# Patient Record
Sex: Female | Born: 1965 | Race: White | Hispanic: No | Marital: Married | Smoking: Current every day smoker
Health system: Southern US, Community
[De-identification: ages and names within clinical notes are randomized; demographics above are authoritative.]

## PROBLEM LIST (undated history)

## (undated) DIAGNOSIS — R569 Unspecified convulsions: Secondary | ICD-10-CM

## (undated) DIAGNOSIS — R351 Nocturia: Secondary | ICD-10-CM

## (undated) DIAGNOSIS — G629 Polyneuropathy, unspecified: Secondary | ICD-10-CM

## (undated) DIAGNOSIS — F329 Major depressive disorder, single episode, unspecified: Secondary | ICD-10-CM

## (undated) DIAGNOSIS — B399 Histoplasmosis, unspecified: Secondary | ICD-10-CM

## (undated) DIAGNOSIS — F419 Anxiety disorder, unspecified: Secondary | ICD-10-CM

## (undated) DIAGNOSIS — R718 Other abnormality of red blood cells: Secondary | ICD-10-CM

## (undated) DIAGNOSIS — M2550 Pain in unspecified joint: Secondary | ICD-10-CM

## (undated) DIAGNOSIS — R51 Headache: Secondary | ICD-10-CM

## (undated) DIAGNOSIS — H32 Chorioretinal disorders in diseases classified elsewhere: Secondary | ICD-10-CM

## (undated) DIAGNOSIS — Z9889 Other specified postprocedural states: Secondary | ICD-10-CM

## (undated) DIAGNOSIS — M2540 Effusion, unspecified joint: Secondary | ICD-10-CM

## (undated) DIAGNOSIS — R519 Headache, unspecified: Secondary | ICD-10-CM

## (undated) DIAGNOSIS — R531 Weakness: Secondary | ICD-10-CM

## (undated) DIAGNOSIS — F32A Depression, unspecified: Secondary | ICD-10-CM

## (undated) DIAGNOSIS — M1990 Unspecified osteoarthritis, unspecified site: Secondary | ICD-10-CM

## (undated) DIAGNOSIS — H548 Legal blindness, as defined in USA: Secondary | ICD-10-CM

## (undated) DIAGNOSIS — R112 Nausea with vomiting, unspecified: Secondary | ICD-10-CM

## (undated) HISTORY — PX: EYE SURGERY: SHX253

## (undated) HISTORY — PX: TUBAL LIGATION: SHX77

## (undated) HISTORY — PX: BACK SURGERY: SHX140

## (undated) HISTORY — PX: MYRINGOTOMY WITH TUBE PLACEMENT: SHX5663

---

## 2003-08-11 ENCOUNTER — Emergency Department (HOSPITAL_COMMUNITY): Admission: EM | Admit: 2003-08-11 | Discharge: 2003-08-12 | Payer: Self-pay | Admitting: *Deleted

## 2003-08-25 ENCOUNTER — Emergency Department (HOSPITAL_COMMUNITY): Admission: EM | Admit: 2003-08-25 | Discharge: 2003-08-25 | Payer: Self-pay | Admitting: Emergency Medicine

## 2003-11-10 ENCOUNTER — Emergency Department (HOSPITAL_COMMUNITY): Admission: EM | Admit: 2003-11-10 | Discharge: 2003-11-10 | Payer: Self-pay | Admitting: Emergency Medicine

## 2005-03-06 ENCOUNTER — Other Ambulatory Visit: Admission: RE | Admit: 2005-03-06 | Discharge: 2005-03-06 | Payer: Self-pay | Admitting: Family Medicine

## 2014-06-20 ENCOUNTER — Telehealth: Payer: Self-pay | Admitting: Family Medicine

## 2014-06-20 NOTE — Telephone Encounter (Signed)
Patient advised she will have to go through out billing department since she is on hold/dismissed on the [employer] side for a bad debt.

## 2014-08-17 NOTE — Patient Instructions (Signed)
Your procedure is scheduled on: 08/22/2014  Report to [facility] at  1130   AM.  Call this number if you have problems the morning of surgery: [phone]   Do not eat food or drink liquids :After Midnight.      Take these medicines the morning of surgery with A SIP OF WATER: none   Do not wear jewelry, make-up or nail polish.  Do not wear lotions, powders, or perfumes.   Do not shave 48 hours prior to surgery.  Do not bring valuables to the hospital.  Contacts, dentures or bridgework may not be worn into surgery.  Leave suitcase in the car. After surgery it may be brought to your room.  For patients admitted to the hospital, checkout time is 11:00 AM the day of discharge.   Patients discharged the day of surgery will not be allowed to drive home.  :     Please read over the following fact sheets that you were given: Coughing and Deep Breathing, Surgical Site Infection Prevention, Anesthesia Post-op Instructions and Care and Recovery After Surgery    Cataract A cataract is a clouding of the lens of the eye. When a lens becomes cloudy, vision is reduced based on the degree and nature of the clouding. Many cataracts reduce vision to some degree. Some cataracts make people more near-sighted as they develop. Other cataracts increase glare. Cataracts that are ignored and become worse can sometimes look white. The white color can be seen through the pupil. CAUSES   Aging. However, cataracts may occur at any age, even in newborns.   Certain drugs.   Trauma to the eye.   Certain diseases such as diabetes.   Specific eye diseases such as chronic inflammation inside the eye or a sudden attack of a rare form of glaucoma.   Inherited or acquired medical problems.  SYMPTOMS   Gradual, progressive drop in vision in the affected eye.   Severe, rapid visual loss. This most often happens when trauma is the cause.  DIAGNOSIS  To detect a cataract, an eye doctor examines the lens. Cataracts are  best diagnosed with an exam of the eyes with the pupils enlarged (dilated) by drops.  TREATMENT  For an early cataract, vision may improve by using different eyeglasses or stronger lighting. If that does not help your vision, surgery is the only effective treatment. A cataract needs to be surgically removed when vision loss interferes with your everyday activities, such as driving, reading, or watching TV. A cataract may also have to be removed if it prevents examination or treatment of another eye problem. Surgery removes the cloudy lens and usually replaces it with a substitute lens (intraocular lens, IOL).  At a time when both you and your doctor agree, the cataract will be surgically removed. If you have cataracts in both eyes, only one is usually removed at a time. This allows the operated eye to heal and be out of danger from any possible problems after surgery (such as infection or poor wound healing). In rare cases, a cataract may be doing damage to your eye. In these cases, your caregiver may advise surgical removal right away. The vast majority of people who have cataract surgery have better vision afterward. HOME CARE INSTRUCTIONS  If you are not planning surgery, you may be asked to do the following:  Use different eyeglasses.   Use stronger or brighter lighting.   Ask your eye doctor about reducing your medicine dose or changing medicines  if it is thought that a medicine caused your cataract. Changing medicines does not make the cataract go away on its own.   Become familiar with your surroundings. Poor vision can lead to injury. Avoid bumping into things on the affected side. You are at a higher risk for tripping or falling.   Exercise extreme care when driving or operating machinery.   Wear sunglasses if you are sensitive to bright light or experiencing problems with glare.  SEEK IMMEDIATE MEDICAL CARE IF:   You have a worsening or sudden vision loss.   You notice redness,  swelling, or increasing pain in the eye.   You have a fever.  Document Released: 01/28/2005 Document Revised: 01/17/2011 Document Reviewed: 09/21/2010 [facility] Patient Information 2012 [place].PATIENT INSTRUCTIONS POST-ANESTHESIA  IMMEDIATELY FOLLOWING SURGERY:  Do not drive or operate machinery for the first twenty four hours after surgery.  Do not make any important decisions for twenty four hours after surgery or while taking narcotic pain medications or sedatives.  If you develop intractable nausea and vomiting or a severe headache please notify your doctor immediately.  FOLLOW-UP:  Please make an appointment with your surgeon as instructed. You do not need to follow up with anesthesia unless specifically instructed to do so.  WOUND CARE INSTRUCTIONS (if applicable):  Keep a dry clean dressing on the anesthesia/puncture wound site if there is drainage.  Once the wound has quit draining you may leave it open to air.  Generally you should leave the bandage intact for twenty four hours unless there is drainage.  If the epidural site drains for more than 36-48 hours please call the anesthesia department.  QUESTIONS?:  Please feel free to call your physician or the hospital operator if you have any questions, and they will be happy to assist you.

## 2014-08-18 ENCOUNTER — Other Ambulatory Visit: Payer: Self-pay

## 2014-08-18 ENCOUNTER — Encounter (HOSPITAL_COMMUNITY): Payer: Self-pay

## 2014-08-18 ENCOUNTER — Encounter (HOSPITAL_COMMUNITY)
Admission: RE | Admit: 2014-08-18 | Discharge: 2014-08-18 | Disposition: A | Discharge status: Home / Self Care | Payer: Medicaid Other | Source: Ambulatory Visit | Attending: Ophthalmology | Admitting: Ophthalmology

## 2014-08-18 DIAGNOSIS — H2512 Age-related nuclear cataract, left eye: Secondary | ICD-10-CM | POA: Insufficient documentation

## 2014-08-18 DIAGNOSIS — Z01818 Encounter for other preprocedural examination: Secondary | ICD-10-CM | POA: Insufficient documentation

## 2014-08-18 HISTORY — DX: Headache: R51

## 2014-08-18 HISTORY — DX: Legal blindness, as defined in USA: H54.8

## 2014-08-18 HISTORY — DX: Headache, unspecified: R51.9

## 2014-08-18 LAB — BASIC METABOLIC PANEL
Anion gap: 8 (ref 5–15)
BUN: 20 mg/dL (ref 6–20)
CALCIUM: 9 mg/dL (ref 8.9–10.3)
CHLORIDE: 102 mmol/L (ref 101–111)
CO2: 25 mmol/L (ref 22–32)
Creatinine, Ser: 0.71 mg/dL (ref 0.44–1.00)
GFR calc Af Amer: >60 mL/min (ref >60)
GLUCOSE: 90 mg/dL (ref 65–99)
POTASSIUM: 4.3 mmol/L (ref 3.5–5.1)
Sodium: 135 mmol/L (ref 135–145)

## 2014-08-18 LAB — CBC WITH DIFFERENTIAL/PLATELET
BASOS ABS: 0 10*3/uL (ref 0.0–0.1)
BASOS PCT: 0 % (ref 0–1)
EOS ABS: 0.4 10*3/uL (ref 0.0–0.7)
EOS PCT: 3 % (ref 0–5)
HEMATOCRIT: 41.6 % (ref 36.0–46.0)
HEMOGLOBIN: 13.9 g/dL (ref 12.0–15.0)
Lymphocytes Relative: 33 % (ref 12–46)
Lymphs Abs: 3.9 10*3/uL (ref 0.7–4.0)
MCH: 29.4 pg (ref 26.0–34.0)
MCHC: 33.4 g/dL (ref 30.0–36.0)
MCV: 88.1 fL (ref 78.0–100.0)
MONO ABS: 0.6 10*3/uL (ref 0.1–1.0)
MONOS PCT: 5 % (ref 3–12)
NEUTROS ABS: 7.2 10*3/uL (ref 1.7–7.7)
Neutrophils Relative %: 59 % (ref 43–77)
Platelets: 350 10*3/uL (ref 150–400)
RBC: 4.72 MIL/uL (ref 3.87–5.11)
RDW: 14.5 % (ref 11.5–15.5)
WBC: 12.1 10*3/uL — ABNORMAL HIGH (ref 4.0–10.5)

## 2014-08-19 LAB — HCG, SERUM, QUALITATIVE: Preg, Serum: NEGATIVE

## 2014-08-19 MED ORDER — CYCLOPENTOLATE-PHENYLEPHRINE OP SOLN OPTIME - NO CHARGE
OPHTHALMIC | Status: AC
  Filled 2014-08-19: qty 2

## 2014-08-19 MED ORDER — TETRACAINE HCL 0.5 % OP SOLN
OPHTHALMIC | Status: AC
  Filled 2014-08-19: qty 2

## 2014-08-19 MED ORDER — LIDOCAINE HCL (PF) 1 % IJ SOLN
INTRAMUSCULAR | Status: AC
  Filled 2014-08-19: qty 2

## 2014-08-19 MED ORDER — NEOMYCIN-POLYMYXIN-DEXAMETH 3.5-10000-0.1 OP SUSP
OPHTHALMIC | Status: AC
  Filled 2014-08-19: qty 5

## 2014-08-19 MED ORDER — LIDOCAINE HCL 3.5 % OP GEL
OPHTHALMIC | Status: AC
  Filled 2014-08-19: qty 1

## 2014-08-19 MED ORDER — PHENYLEPHRINE HCL 2.5 % OP SOLN
OPHTHALMIC | Status: AC
  Filled 2014-08-19: qty 15

## 2014-08-22 ENCOUNTER — Ambulatory Visit (HOSPITAL_COMMUNITY): Payer: Medicaid Other | Admitting: Anesthesiology

## 2014-08-22 ENCOUNTER — Ambulatory Visit (HOSPITAL_COMMUNITY)
Admission: RE | Admit: 2014-08-22 | Discharge: 2014-08-22 | Disposition: A | Discharge status: Home / Self Care | Payer: Medicaid Other | Source: Ambulatory Visit | Attending: Ophthalmology | Admitting: Ophthalmology

## 2014-08-22 ENCOUNTER — Encounter (HOSPITAL_COMMUNITY)
Admission: RE | Disposition: A | Discharge status: Home / Self Care | Payer: Self-pay | Source: Ambulatory Visit | Attending: Ophthalmology

## 2014-08-22 ENCOUNTER — Encounter (HOSPITAL_COMMUNITY): Payer: Self-pay | Admitting: *Deleted

## 2014-08-22 DIAGNOSIS — H269 Unspecified cataract: Secondary | ICD-10-CM | POA: Diagnosis present

## 2014-08-22 DIAGNOSIS — H04123 Dry eye syndrome of bilateral lacrimal glands: Secondary | ICD-10-CM | POA: Insufficient documentation

## 2014-08-22 DIAGNOSIS — Z79899 Other long term (current) drug therapy: Secondary | ICD-10-CM | POA: Insufficient documentation

## 2014-08-22 DIAGNOSIS — H2512 Age-related nuclear cataract, left eye: Secondary | ICD-10-CM | POA: Insufficient documentation

## 2014-08-22 DIAGNOSIS — F17200 Nicotine dependence, unspecified, uncomplicated: Secondary | ICD-10-CM | POA: Insufficient documentation

## 2014-08-22 HISTORY — PX: CATARACT EXTRACTION W/PHACO: SHX586

## 2014-08-22 SURGERY — PHACOEMULSIFICATION, CATARACT, WITH IOL INSERTION
Anesthesia: Monitor Anesthesia Care | Site: Eye | Laterality: Left

## 2014-08-22 MED ORDER — FENTANYL CITRATE (PF) 100 MCG/2ML IJ SOLN
INTRAMUSCULAR | Status: AC
  Filled 2014-08-22: qty 2

## 2014-08-22 MED ORDER — CYCLOPENTOLATE-PHENYLEPHRINE 0.2-1 % OP SOLN
1.0000 [drp] | OPHTHALMIC | Status: AC
  Administered 2014-08-22 (×3): 1 [drp] via OPHTHALMIC

## 2014-08-22 MED ORDER — TRYPAN BLUE 0.06 % OP SOLN
OPHTHALMIC | Status: AC
  Filled 2014-08-22: qty 0.5

## 2014-08-22 MED ORDER — LIDOCAINE HCL (PF) 1 % IJ SOLN
INTRAMUSCULAR | Status: DC | PRN
  Administered 2014-08-22: .5 mL

## 2014-08-22 MED ORDER — TETRACAINE HCL 0.5 % OP SOLN
1.0000 [drp] | OPHTHALMIC | Status: AC
  Administered 2014-08-22 (×3): 1 [drp] via OPHTHALMIC

## 2014-08-22 MED ORDER — EPINEPHRINE HCL 1 MG/ML IJ SOLN
INTRAOCULAR | Status: DC | PRN
  Administered 2014-08-22: 12:00:00

## 2014-08-22 MED ORDER — LACTATED RINGERS IV SOLN
INTRAVENOUS | Status: DC
  Administered 2014-08-22: 11:00:00 via INTRAVENOUS

## 2014-08-22 MED ORDER — NEOMYCIN-POLYMYXIN-DEXAMETH 3.5-10000-0.1 OP SUSP
OPHTHALMIC | Status: DC | PRN
  Administered 2014-08-22: 1 [drp] via OPHTHALMIC

## 2014-08-22 MED ORDER — POVIDONE-IODINE 5 % OP SOLN
OPHTHALMIC | Status: DC | PRN
  Administered 2014-08-22: 1 via OPHTHALMIC

## 2014-08-22 MED ORDER — PHENYLEPHRINE HCL 2.5 % OP SOLN
1.0000 [drp] | OPHTHALMIC | Status: AC
  Administered 2014-08-22 (×3): 1 [drp] via OPHTHALMIC

## 2014-08-22 MED ORDER — MIDAZOLAM HCL 2 MG/2ML IJ SOLN
INTRAMUSCULAR | Status: AC
  Filled 2014-08-22: qty 2

## 2014-08-22 MED ORDER — LIDOCAINE HCL 3.5 % OP GEL
1.0000 "application " | Freq: Once | OPHTHALMIC | Status: AC
  Administered 2014-08-22: 1 via OPHTHALMIC

## 2014-08-22 MED ORDER — PROVISC 10 MG/ML IO SOLN
INTRAOCULAR | Status: DC | PRN
  Administered 2014-08-22: 0.85 mL via INTRAOCULAR

## 2014-08-22 MED ORDER — MIDAZOLAM HCL 2 MG/2ML IJ SOLN
1.0000 mg | INTRAMUSCULAR | Status: DC | PRN
  Administered 2014-08-22: 2 mg via INTRAVENOUS

## 2014-08-22 MED ORDER — EPINEPHRINE HCL 1 MG/ML IJ SOLN
INTRAMUSCULAR | Status: AC
  Filled 2014-08-22: qty 1

## 2014-08-22 MED ORDER — FENTANYL CITRATE (PF) 100 MCG/2ML IJ SOLN
25.0000 ug | INTRAMUSCULAR | Status: AC
  Administered 2014-08-22 (×2): 25 ug via INTRAVENOUS

## 2014-08-22 MED ORDER — BSS IO SOLN
INTRAOCULAR | Status: DC | PRN
  Administered 2014-08-22: 15 mL via INTRAOCULAR

## 2014-08-22 SURGICAL SUPPLY — 12 items
CLOTH BEACON ORANGE TIMEOUT ST (SAFETY) ×3 IMPLANT
EYE SHIELD UNIVERSAL CLEAR (GAUZE/BANDAGES/DRESSINGS) ×3 IMPLANT
GLOVE BIOGEL PI IND STRL 6.5 (GLOVE) ×1 IMPLANT
GLOVE BIOGEL PI IND STRL 7.0 (GLOVE) ×1 IMPLANT
GLOVE BIOGEL PI INDICATOR 6.5 (GLOVE) ×2
GLOVE BIOGEL PI INDICATOR 7.0 (GLOVE) ×2
PAD ARMBOARD 7.5X6 YLW CONV (MISCELLANEOUS) ×3 IMPLANT
SIGHTPATH CAT PROC W REG LENS (Ophthalmic Related) ×3 IMPLANT
SYRINGE LUER LOK 1CC (MISCELLANEOUS) ×3 IMPLANT
TAPE SURG TRANSPARENT 2IN (GAUZE/BANDAGES/DRESSINGS) ×1 IMPLANT
TAPE TRANSPARENT 2IN (GAUZE/BANDAGES/DRESSINGS) ×2
WATER STERILE IRR 250ML POUR (IV SOLUTION) ×3 IMPLANT

## 2014-08-22 NOTE — Transfer of Care (Signed)
Immediate Anesthesia Transfer of Care Note  Patient: Teresa Yu  Procedure(s) Performed: Procedure(s) with comments: CATARACT EXTRACTION PHACO AND INTRAOCULAR LENS PLACEMENT LEFT EYE (Left) - CDE 24.24  Patient Location: Short Stay  Anesthesia Type:MAC  Level of Consciousness: awake  Airway & Oxygen Therapy: Patient Spontanous Breathing  Post-op Assessment: Report given to RN  Post vital signs: Reviewed and stable  Last Vitals:  Filed Vitals:   08/22/14 1120  BP: 107/64  Temp:   Resp: 12    Complications: No apparent anesthesia complications

## 2014-08-22 NOTE — Op Note (Signed)
Date of Admission: 08/22/2014  Date of Surgery: 08/22/2014  Pre-Op Dx: Cataract  Left  Eye  Post-Op Dx: Senile Mature Cataract Left Eye,  Dx Code H25.12  Surgeon: Gemma PayorKerry Peggyann Zwiefelhofer, M.D.  Assistants: None  Anesthesia: Topical with MAC  Indications: Painless, progressive loss of vision with compromise of daily activities.  Surgery: Cataract Extraction with Intraocular lens Implant Left Eye  Discription: The patient had dilating drops and viscous lidocaine placed into the Left eye in the pre-op holding area. After transfer to the operating room, a time out was performed. The patient was then prepped and draped. Beginning with a 75 degree blade a paracentesis port was made at the surgeon's 2 o'clock position. The anterior chamber was then filled with 1% non-preserved lidocaine. The anterior chamber was then filled with Vision Blue to stain the anterior capsule. The Vision Blue was displaced from the anterior chamber with BSS. This was followed by filling the anterior chamber with Viscoat. A 2.344mm keratome blade was used to make a clear corneal incision at the temporal limbus. A bent cystatome needle was used to create a continuous tear capsulotomy. Hydrodissection was performed with balanced salt solution on a Fine canula. The lens nucleus was then removed using the phacoemulsification handpiece. Residual cortex was removed with the I&A handpiece. The anterior chamber and capsular bag were refilled with Provisc. A posterior chamber intraocular lens was placed into the capsular bag with it's injector. The implant was positioned with the Kuglan hook. The Provisc was then removed from the anterior chamber and capsular bag with the I&A handpiece. Stromal hydration of the main incision and paracentesis port was performed with BSS on a Fine canula. The wounds were tested for leak which was negative. The patient tolerated the procedure well. There were no operative complications. The patient was then transferred to  the recovery room in stable condition.  Complications: None  Specimen: None  EBL: None  Prosthetic device: B&L enVista, MX60, power 19.5D, SN [id number].

## 2014-08-22 NOTE — Anesthesia Preprocedure Evaluation (Signed)
Anesthesia Evaluation  Patient identified by MRN, date of birth, ID band Patient awake    Reviewed: Allergy & Precautions, NPO status , Patient's Chart, lab work & pertinent test results  Airway Mallampati: II  TM Distance: >3 FB     Dental  (+) Edentulous Upper   Pulmonary Current Smoker,  breath sounds clear to auscultation        Cardiovascular negative cardio ROS  Rhythm:Regular Rate:Normal     Neuro/Psych  Headaches,    GI/Hepatic negative GI ROS,   Endo/Other    Renal/GU      Musculoskeletal   Abdominal   Peds  Hematology   Anesthesia Other Findings   Reproductive/Obstetrics                             Anesthesia Physical Anesthesia Plan  ASA: II  Anesthesia Plan: MAC   Post-op Pain Management:    Induction: Intravenous  Airway Management Planned: Nasal Cannula  Additional Equipment:   Intra-op Plan:   Post-operative Plan:   Informed Consent: I have reviewed the patients History and Physical, chart, labs and discussed the procedure including the risks, benefits and alternatives for the proposed anesthesia with the patient or authorized representative who has indicated his/her understanding and acceptance.     Plan Discussed with:   Anesthesia Plan Comments:         Anesthesia Quick Evaluation

## 2014-08-22 NOTE — Anesthesia Postprocedure Evaluation (Signed)
  Anesthesia Post-op Note  Patient: Teresa Yu  Procedure(s) Performed: Procedure(s) with comments: CATARACT EXTRACTION PHACO AND INTRAOCULAR LENS PLACEMENT LEFT EYE (Left) - CDE 24.24  Patient Location: Short Stay  Anesthesia Type:MAC  Level of Consciousness: awake, alert  and oriented  Airway and Oxygen Therapy: Patient Spontanous Breathing  Post-op Pain: none  Post-op Assessment: Post-op Vital signs reviewed, Patient's Cardiovascular Status Stable, Respiratory Function Stable, Patent Airway and No signs of Nausea or vomiting              Post-op Vital Signs: Reviewed and stable  Last Vitals:  Filed Vitals:   08/22/14 1120  BP: 107/64  Temp:   Resp: 12    Complications: No apparent anesthesia complications

## 2014-08-22 NOTE — H&P (Signed)
I have reviewed the H&P, the patient was re-examined, and I have identified no interval changes in medical condition and plan of care since the history and physical of record  

## 2014-08-22 NOTE — Discharge Instructions (Signed)

## 2014-08-23 ENCOUNTER — Encounter (HOSPITAL_COMMUNITY): Payer: Self-pay | Admitting: Ophthalmology

## 2015-06-12 ENCOUNTER — Encounter (HOSPITAL_COMMUNITY): Payer: Self-pay | Admitting: Emergency Medicine

## 2015-06-12 ENCOUNTER — Emergency Department (HOSPITAL_COMMUNITY)
Admission: EM | Admit: 2015-06-12 | Discharge: 2015-06-12 | Disposition: A | Discharge status: Home / Self Care | Payer: Medicaid Other | Attending: Emergency Medicine | Admitting: Emergency Medicine

## 2015-06-12 DIAGNOSIS — F17210 Nicotine dependence, cigarettes, uncomplicated: Secondary | ICD-10-CM | POA: Diagnosis not present

## 2015-06-12 DIAGNOSIS — G253 Myoclonus: Secondary | ICD-10-CM

## 2015-06-12 DIAGNOSIS — R55 Syncope and collapse: Secondary | ICD-10-CM | POA: Diagnosis present

## 2015-06-12 DIAGNOSIS — Z791 Long term (current) use of non-steroidal anti-inflammatories (NSAID): Secondary | ICD-10-CM | POA: Diagnosis not present

## 2015-06-12 DIAGNOSIS — Z79899 Other long term (current) drug therapy: Secondary | ICD-10-CM | POA: Diagnosis not present

## 2015-06-12 LAB — BASIC METABOLIC PANEL
Anion gap: 10 (ref 5–15)
BUN: 13 mg/dL (ref 6–20)
CHLORIDE: 103 mmol/L (ref 101–111)
CO2: 25 mmol/L (ref 22–32)
Calcium: 9.1 mg/dL (ref 8.9–10.3)
Creatinine, Ser: 0.76 mg/dL (ref 0.44–1.00)
GFR calc non Af Amer: >60 mL/min (ref >60)
Glucose, Bld: 97 mg/dL (ref 65–99)
Potassium: 3.8 mmol/L (ref 3.5–5.1)
Sodium: 138 mmol/L (ref 135–145)

## 2015-06-12 LAB — CBC
HEMATOCRIT: 37.7 % (ref 36.0–46.0)
HEMOGLOBIN: 12.7 g/dL (ref 12.0–15.0)
MCH: 29.2 pg (ref 26.0–34.0)
MCHC: 33.7 g/dL (ref 30.0–36.0)
MCV: 86.7 fL (ref 78.0–100.0)
Platelets: 407 10*3/uL — ABNORMAL HIGH (ref 150–400)
RBC: 4.35 MIL/uL (ref 3.87–5.11)
RDW: 15.3 % (ref 11.5–15.5)
WBC: 13.5 10*3/uL — ABNORMAL HIGH (ref 4.0–10.5)

## 2015-06-12 NOTE — ED Notes (Signed)
Pt reports multiple episodes of "blacking out." States that his has happened for years and has had MRI's. States they are occurring more frequently and only last for a few seconds. States neurology told her it was "due to brain being overloaded." Pt noted to have multiple spasms of hands during triage. Aox4.

## 2015-06-12 NOTE — ED Provider Notes (Signed)
CSN: [id number]     Arrival date & time 06/12/15  1047 History  By signing my name below, I, Teresa Yu, attest that this documentation has been prepared under the direction and in the presence of Teresa HongBrian Mosella Kasa, MD . Electronically Signed: [name], Scribe. 06/12/2015. 11:44 AM.    Chief Complaint  Patient presents with  . Loss of Consciousness    The history is provided by the patient. No language interpreter was used.    HPI Comments:  Teresa Yu is a 50 y.o. female who presents to the Emergency Department complaining of "1 sec blackouts" since 0730 this AM. She describes jerking motion with these episodes and denies LOC. Pt reports h/o same x years. She states she used to blame them on her alcohol use but has not had ETOH in the last 9 months. Last month she had several episodes and notes these episodes are often when she feels overwhelmed and stressed. Her last episode was last month when her  daughter was arrested. Pt notes associated insomnia. Pt smokes ~ 1ppd. She denies illicit drug use. No alleviating factors noted. Pt had EEG ~ 1 month ago. Pt is currently on Cymbalta, Neurontin, and nucynta; notes symptoms began long before she was placed on these meds.   Norris[place]rator- [place] [facility]   Past Medical History  Diagnosis Date  . Heart murmur   . Heart palpitations     reported by patient  . Headache     frequent  . Legally blind    Past Surgical History  Procedure Laterality Date  . Tubal ligation    . Eye surgery Bilateral 1995, 1998    macular generation  . Myringotomy with tube placement      as a child  . Cesarean section  1984  . Cataract extraction w/phaco Left 08/22/2014    Procedure: CATARACT EXTRACTION PHACO AND INTRAOCULAR LENS PLACEMENT LEFT EYE;  Surgeon: [name], MD;  Location: [facility];  Service: Ophthalmology;  Laterality: Left;  CDE 24.24   Family History  Problem Relation Age of Onset  . Cancer Brother    Social History   Substance Use Topics  . Smoking status: Current Every Day Smoker -- 1.00 packs/day for 34 years    Types: Cigarettes  . Smokeless tobacco: None  . Alcohol Use: No     Comment: occasionallly beer and vodka   OB History    No data available     Review of Systems  Constitutional: Negative for fever.  Respiratory: Negative for shortness of breath.   Cardiovascular: Negative for chest pain.  Gastrointestinal: Negative for vomiting and diarrhea.  Neurological: Negative for dizziness, syncope and weakness.       + Muscle jerking  All other systems reviewed and are negative.   Allergies  Asa  Home Medications   Prior to Admission medications   Medication Sig Start Date End Date Taking? Authorizing Provider  BEE POLLEN PO Take 580 mg by mouth daily.   Yes Historical Provider, MD  Cyanocobalamin (B-12 PO) Take 250 mg by mouth daily.   Yes Historical Provider, MD  DULoxetine (CYMBALTA) 60 MG capsule Take 60 mg by mouth daily.  06/06/15  Yes Historical Provider, MD  Fish Oil-Cholecalciferol (OMEGA-3 FISH OIL-VITAMIN D3) 1200-1000 MG-UNIT CAPS Take 2,000 Units by mouth.   Yes Historical Provider, MD  gabapentin (NEURONTIN) 600 MG tablet Take 600 mg by mouth 3 (three) times daily.    Yes Historical Provider, MD  ibuprofen (ADVIL,MOTRIN)  200 MG tablet Take 600 mg by mouth every 6 (six) hours as needed.   Yes Historical Provider, MD  LORazepam (ATIVAN) 0.5 MG tablet Take 0.5 mg by mouth 2 (two) times daily.  05/18/15  Yes Historical Provider, MD  naproxen sodium (ANAPROX) 220 MG tablet Take 660 mg by mouth 2 (two) times daily with a meal.   Yes Historical Provider, MD  Omega-3 Fatty Acids (FISH OIL PO) Take 2 capsules by mouth daily.   Yes Historical Provider, MD  Tapentadol HCl (NUCYNTA ER) 100 MG TB12 Take 100 mg by mouth 4 (four) times daily.    Yes Historical Provider, MD   BP 129/69 mmHg  Pulse 79  Temp(Src) 98.2 F (36.8 C) (Oral)  Resp 18  Ht 5' (1.524 m)  Wt 200 lb (90.719 kg)   BMI 39.06 kg/m2  SpO2 96%  LMP 04/30/2015 Physical Exam  Constitutional: She appears well-developed and well-nourished. No distress.  HENT:  Head: Normocephalic and atraumatic.  Mouth/Throat: Oropharynx is clear and moist. No oropharyngeal exudate.  Eyes: Conjunctivae and EOM are normal. Pupils are equal, round, and reactive to light. Right eye exhibits no discharge. Left eye exhibits no discharge. No scleral icterus.  Neck: Normal range of motion. Neck supple. No JVD present. No thyromegaly present.  Cardiovascular: Normal rate, regular rhythm, normal heart sounds and intact distal pulses.  Exam reveals no gallop and no friction rub.   No murmur heard. Pulmonary/Chest: Effort normal. No respiratory distress. She has wheezes. She has no rales.  Slight inspiratory wheezing   Abdominal: Soft. Bowel sounds are normal. She exhibits no distension and no mass. There is no tenderness.  Musculoskeletal: Normal range of motion. She exhibits no edema or tenderness.  Lymphadenopathy:    She has no cervical adenopathy.  Neurological: She is alert. Coordination normal.  involuntary myoclonic jerking ~ every 2-3 min without LOC  Skin: Skin is warm and dry. No rash noted. No erythema.  Psychiatric: She has a normal mood and affect. Her behavior is normal.  Nursing note and vitals reviewed.   ED Course  Procedures  DIAGNOSTIC STUDIES:  Oxygen Saturation is 98% on RA, normal by my interpretation.    COORDINATION OF CARE:  11:28 AM Discussed treatment plan with pt at bedside and pt agreed to plan.  Labs Review Labs Reviewed  CBC - Abnormal; Notable for the following:    WBC 13.5 (*)    Platelets 407 (*)    All other components within normal limits  BASIC METABOLIC PANEL    Imaging Review No results found. I have personally reviewed and evaluated these images and lab results as part of my medical decision-making.   EKG Interpretation   Date/Time:  Monday Jun 12 2015 11:13:06  EDT Ventricular Rate:  79 PR Interval:  165 QRS Duration: 78 QT Interval:  366 QTC Calculation: 419 R Axis:   69 Text Interpretation:  Sinus rhythm ST elev, probable normal early repol  pattern Baseline wander in lead(s) V6 since last tracing no significant  change Confirmed by Hyacinth Meeker  MD, Tomeca Helm ([id number]) on 06/12/2015 12:37:35 PM      Meds given in ED:  Medications - No data to display  New Prescriptions   No medications on file     MDM   Final diagnoses:  Myoclonic jerking    The patient is very well-appearing, she has occasional myoclonic jerks in the room but has otherwise totally normal exam and is very unremarkable in both her vital signs and  her symptoms. She has had an MRI of the brain, EEG, she reports that she was told that both of these were normal, I have tried to call the neurology office she goes to in [place] several times but he goes to voicemail and I am unable to get a hold of any providers. She does appear stable for discharge, I will refer her to neurology in [place], she is stable for discharge at this time.   I personally performed the services described in this documentation, which was scribed in my presence. The recorded information has been reviewed and is accurate.       Teresa Hong, MD 06/12/15 [phone]

## 2015-06-26 ENCOUNTER — Ambulatory Visit: Payer: Self-pay | Admitting: Neurology

## 2015-07-05 ENCOUNTER — Encounter: Payer: Self-pay | Admitting: Neurology

## 2015-07-05 ENCOUNTER — Ambulatory Visit (INDEPENDENT_AMBULATORY_CARE_PROVIDER_SITE_OTHER): Payer: Medicaid Other | Admitting: Neurology

## 2015-07-05 VITALS — BP 132/87 | HR 87 | Resp 20 | Ht 60.0 in | Wt 209.0 lb

## 2015-07-05 DIAGNOSIS — R292 Abnormal reflex: Secondary | ICD-10-CM | POA: Diagnosis not present

## 2015-07-05 DIAGNOSIS — G253 Myoclonus: Secondary | ICD-10-CM | POA: Diagnosis not present

## 2015-07-05 DIAGNOSIS — G1221 Amyotrophic lateral sclerosis: Secondary | ICD-10-CM

## 2015-07-05 DIAGNOSIS — M5000 Cervical disc disorder with myelopathy, unspecified cervical region: Secondary | ICD-10-CM | POA: Diagnosis not present

## 2015-07-05 DIAGNOSIS — G1229 Other motor neuron disease: Secondary | ICD-10-CM

## 2015-07-05 DIAGNOSIS — R569 Unspecified convulsions: Secondary | ICD-10-CM

## 2015-07-05 NOTE — Progress Notes (Signed)
[facility]    Provider:  Dr Lucia Gaskins Referring Provider: No ref. provider found Primary Care Physician:  Pcp Not In System  CC:  Myoclonic jerks  HPI:  Teresa Yu is a 50 y.o. female here as a referral for "things get too busy in my head". PMHx of legal blindness, headache, insomnia,  She feels "chaos". She can't keep track of anything in her head. It happens during stress and it happens not during stress. She feels exhausted when it is over and her head hurts. 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 Daughter and husband provide information, daughter says her brain "just stops", hey eyes are open, She went for her disability hearing and she was having so many of the jerks that she was sent to the ED. The doctor at the ED thought she was having myoclonic jerking. She has fallen due to these. Now they are back to back. Her arms go up over her head, bilaterally, both the same and her knees start to buck;e. She has fallen, hurt herself. She can feel them coming on. She had an MRI and an EEG which were normal. Denies any dementia, and dystonia. 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 She used to blame this on alcohol but she stopped drinking and still continued. They are lasting longer and more uncontrolled. They used to be twitches and now they are more severe with falling. In the ED it lasted for hours. Sometimes she doesn' have them for a week sometimes she has them a few times a week other times happens for hours. They start and nothing makes them better or worse. No other associated symptoms, no focal weakness, she feels drained afterwards. Worse with drama and agitation. Unclear if sleep deprivation makes them worse. No other focal neurologic symptoms. No FHx seizures.  Reviewed notes, labs and imaging from outside physicians, which showed:  MRI of the brain and eeg  reportedly normal (will request)  Patient was seen in the ED 06/12/2015 for multiple episode of "blacking out". Was told that her "due to brain being overloaded." Pt noted to have multiple spasms of hands during triage. Aox4. She described jerking motion with the episodes and denied LOC. Pt reported h/o same x years. She stated she used to blame them on her alcohol use but has not had ETOH in the last 9 months. Last month she had several episodes and noted these episodes often when she feels overwhelmed and stressed. Her last episode was last month when her daughter was arrested. Pt notes associated insomnia. Pt smokes ~ 1ppd. She denies illicit drug use. No alleviating factors noted. Pt had EEG ~ 1 month ago. Pt is currently on Cymbalta, Neurontin, and nucynta; notes symptoms began long before she was placed on these meds. Exam significant for "involuntary myoclonic jerking ~ every 2-3 min without LOC"  EKG: Date/Time: Monday Jun 12 2015 11:13:06 EDT Ventricular Rate: 79 PR Interval: 165 QRS Duration: 78 QT Interval: 366 QTC Calculation: 419 R Axis: 69   Cbc with elevated WBCs 13.5, bmp nml   Review of Systems: Patient complains of symptoms per HPI as well as the following symptoms: . Pertinent negatives per HPI. All others negative.   Social History   Social History  . Marital Status: Unknown    Spouse Name: N/A  . Number of Children: N/A  . Years of Education: N/A   Occupational History  .  Not on file.   Social History Main Topics  . Smoking status: Current Every Day Smoker -- 1.00 packs/day for 34 years    Types: Cigarettes  . Smokeless tobacco: Not on file  . Alcohol Use: No     Comment: occasionallly beer and vodka  . Drug Use: No     Comment: 2 per week  . Sexual Activity: Yes   Other Topics Concern  . Not on file   Social History Narrative    Family History  Problem Relation Age of Onset  . Cancer Brother     Past Medical History  Diagnosis Date  .  Heart murmur   . Heart palpitations     reported by patient  . Headache     frequent  . Legally blind     Past Surgical History  Procedure Laterality Date  . Tubal ligation    . Eye surgery Bilateral 1995, 1998    macular generation  . Myringotomy with tube placement      as a child  . Cesarean section  1984  . Cataract extraction w/phaco Left 08/22/2014    Procedure: CATARACT EXTRACTION PHACO AND INTRAOCULAR LENS PLACEMENT LEFT EYE;  Surgeon: [name], MD;  Location: [facility];  Service: Ophthalmology;  Laterality: Left;  CDE 24.24    Current Outpatient Prescriptions  Medication Sig Dispense Refill  . BEE POLLEN PO Take 580 mg by mouth daily.    . Cyanocobalamin (B-12 PO) Take 250 mg by mouth daily.    . DULoxetine (CYMBALTA) 60 MG capsule Take 60 mg by mouth daily.     . Fish Oil-Cholecalciferol (OMEGA-3 FISH OIL-VITAMIN D3) 1200-1000 MG-UNIT CAPS Take 2,000 Units by mouth.    . gabapentin (NEURONTIN) 600 MG tablet Take 600 mg by mouth 3 (three) times daily.     Marland Kitchen. ibuprofen (ADVIL,MOTRIN) 200 MG tablet Take 600 mg by mouth every 6 (six) hours as needed.    Marland Kitchen. LORazepam (ATIVAN) 0.5 MG tablet Take 0.5 mg by mouth 2 (two) times daily.     . naproxen sodium (ANAPROX) 220 MG tablet Take 660 mg by mouth 2 (two) times daily with a meal.    . Omega-3 Fatty Acids (FISH OIL PO) Take 2 capsules by mouth daily.    . Tapentadol HCl (NUCYNTA ER) 100 MG TB12 Take 100 mg by mouth 4 (four) times daily.      No current facility-administered medications for this visit.    Allergies as of 07/05/2015 - Review Complete 06/12/2015  Allergen Reaction Noted  . Asa [aspirin] Rash 08/11/2014    Vitals: BP 132/87 mmHg  Pulse 87  Resp 20  Ht 5' (1.524 m)  Wt 209 lb (94.802 kg)  BMI 40.82 kg/m2  LMP 04/30/2015 Last Weight:  Wt Readings from Last 1 Encounters:  07/05/15 209 lb (94.802 kg)   Last Height:   Ht Readings from Last 1 Encounters:  07/05/15 5' (1.524 m)     Physical  exam: Exam: Gen: NAD, conversant, well nourised, obese, well groomed                     CV: RRR, no MRG. No Carotid Bruits. No peripheral edema, warm, nontender Eyes: Conjunctivae clear without exudates or hemorrhage  Neuro: Detailed Neurologic Exam  Speech:    Speech is normal; fluent and spontaneous with normal comprehension.  Cognition:    The patient is oriented to person, place, and time;     recent and remote memory  intact;     language fluent;     normal attention, concentration,     fund of knowledge Cranial Nerves:    The pupils are equal, round, and reactive to light. The fundi are normal and spontaneous venous pulsations are present. Visual fields are full to finger confrontation. Extraocular movements are intact. Trigeminal sensation is intact and the muscles of mastication are normal. The face is symmetric. The palate elevates in the midline. Hearing intact. Voice is normal. Shoulder shrug is normal. The tongue has normal motion without fasciculations.   Coordination:    Normal finger to nose and heel to shin. Normal rapid alternating movements.   Gait:    Heel-toe and tandem gait are normal.   Motor Observation:    No asymmetry, no atrophy, and no involuntary movements noted. Tone:    Normal muscle tone.    Posture:    Posture is normal. normal erect    Strength:    Strength is V/V in the upper and lower limbs.      Sensation: intact to LT     Reflex Exam:  DTR's:    Deep tendon reflexes in the upper and lower extremities are brisk bilaterally.   Toes:    The toes are downgoing bilaterally.   Clonus:    Clonus is absent.     Assessment/Plan:  50 year old with jerking movements for 10 years especially in the setting of stress. She was told they were non-epileptic events. MRI and eeg was normal per report. ED diagnosed her with myoclonus. Description is inconsistent with what she told the ED, unclear if these are due to an organic or psychiatric  etiology.  Myoclonus may develop in response to infection, head or spinal cord injury, stroke, brain tumors, kidney or liver failure, lipid storage disease, chemical or drug poisoning, or other disorders.  For example, myoclonic jerking may develop in patients with multiple sclerosis, Parkinson's disease, Alzheimer's disease, or Creutzfeldt-Jakob disease. Myoclonic jerks commonly occur in persons with epilepsy, a disorder in which the electrical activity in the brain becomes disordered leading to seizures.  Remember to drink plenty of fluid, eat healthy meals and do not skip any meals. Try to eat protein with a every meal and eat a healthy snack such as fruit or nuts in between meals. Try to keep a regular sleep-wake schedule and try to exercise daily, particularly in the form of walking, 20-30 minutes a day, if you can.   As far as diagnostic testing: MRI of the cervical spine, 3-day EEG  I would like to see you back in 4 months, sooner if we need to. Please call us with any interim questions, concerns, problems, updates or refill requests.   Our phone number is [phone]. We also have an after hours call service for urgent matters and there is a physician on-call for urgent questions. For any emergencies you know to call 911 or go to the nearest emergency room   Patient is unable to drive, operate heavy machinery, perform activities at heights or participate in water activities until 6 months episode free   [facility] [address] [place], [place] [address]  Phone [phone] Fax [phone]

## 2015-07-05 NOTE — Patient Instructions (Signed)
Remember to drink plenty of fluid, eat healthy meals and do not skip any meals. Try to eat protein with a every meal and eat a healthy snack such as fruit or nuts in between meals. Try to keep a regular sleep-wake schedule and try to exercise daily, particularly in the form of walking, 20-30 minutes a day, if you can.   As far as diagnostic testing: MIi of the cervical spine, 3-day EEG  I would like to see you back in 4 months, sooner if we need to. Please call us with any interim questions, concerns, problems, updates or refill requests.   Our phone number is [phone]. We also have an after hours call service for urgent matters and there is a physician on-call for urgent questions. For any emergencies you know to call 911 or go to the nearest emergency room

## 2015-07-06 ENCOUNTER — Encounter: Payer: Self-pay | Admitting: Neurology

## 2015-07-06 DIAGNOSIS — R569 Unspecified convulsions: Secondary | ICD-10-CM | POA: Insufficient documentation

## 2015-07-11 ENCOUNTER — Ambulatory Visit
Admission: RE | Admit: 2015-07-11 | Discharge: 2015-07-11 | Disposition: A | Discharge status: Home / Self Care | Payer: Medicaid Other | Source: Ambulatory Visit | Attending: Neurology | Admitting: Neurology

## 2015-07-11 ENCOUNTER — Encounter: Payer: Self-pay | Admitting: *Deleted

## 2015-07-11 DIAGNOSIS — G1221 Amyotrophic lateral sclerosis: Secondary | ICD-10-CM

## 2015-07-11 DIAGNOSIS — G253 Myoclonus: Secondary | ICD-10-CM | POA: Diagnosis not present

## 2015-07-11 DIAGNOSIS — R292 Abnormal reflex: Secondary | ICD-10-CM | POA: Diagnosis not present

## 2015-07-11 DIAGNOSIS — G1229 Other motor neuron disease: Secondary | ICD-10-CM

## 2015-07-11 DIAGNOSIS — M5000 Cervical disc disorder with myelopathy, unspecified cervical region: Secondary | ICD-10-CM

## 2015-07-11 NOTE — Progress Notes (Signed)
Received physician status notification from [facility] that the patient has been scheduled for 07/17/15-07/20/15 for 72hr AMB EEG.

## 2015-07-13 ENCOUNTER — Telehealth: Payer: Self-pay | Admitting: *Deleted

## 2015-07-13 NOTE — Telephone Encounter (Signed)
Pt called office back. Relayed results per Dr Lucia GaskinsAhern note. She verbalized understanding.

## 2015-07-13 NOTE — Telephone Encounter (Signed)
LVM for pt to call about results. Gave [facility] phone number.  

## 2015-07-13 NOTE — Telephone Encounter (Signed)
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

## 2015-07-31 NOTE — Progress Notes (Signed)
Dr Lucia GaskinsAhern- I can call the patient once you review, thank you! I put with your notes from today.   Received physician status notification from [facility] that interpretation has been completed by Dr Hilda BladesArmstrong. We received results via fax. Gave results to Dr Lucia GaskinsAhern for review.

## 2015-07-31 NOTE — Progress Notes (Signed)
The eeg was normal, the event she had was not a seizure thanks.

## 2015-08-01 ENCOUNTER — Telehealth: Payer: Self-pay | Admitting: *Deleted

## 2015-08-01 NOTE — Progress Notes (Signed)
See other phone note

## 2015-08-01 NOTE — Telephone Encounter (Signed)
LVM for pt to call about EEG results. Gave [facility] phone number. Ok to inform pt EEG normal, the event she had was not a seizure per Dr Lucia GaskinsAhern.

## 2015-08-02 NOTE — Telephone Encounter (Signed)
Tried calling pt again. LVM for her to call. OK to inform pt EEG normal, event she had was not a seizure per Dr Lucia GaskinsAhern.

## 2015-08-03 NOTE — Telephone Encounter (Signed)
Spoke to pt about normal EEG per Dr Lucia GaskinsAhern. She verbalized understanding. Reminded her of f/u on 11/06/15 at 9am.

## 2015-11-06 ENCOUNTER — Ambulatory Visit: Payer: Medicaid Other | Admitting: Neurology

## 2015-11-06 ENCOUNTER — Telehealth: Payer: Self-pay | Admitting: *Deleted

## 2015-11-06 NOTE — Telephone Encounter (Signed)
no showed f/u 

## 2015-11-09 ENCOUNTER — Encounter: Payer: Self-pay | Admitting: Neurology

## 2016-03-19 ENCOUNTER — Ambulatory Visit (HOSPITAL_COMMUNITY)
Admission: RE | Admit: 2016-03-19 | Discharge: 2016-03-19 | Disposition: A | Discharge status: Home / Self Care | Payer: Medicaid Other | Source: Ambulatory Visit | Attending: Anesthesiology | Admitting: Anesthesiology

## 2016-03-19 ENCOUNTER — Other Ambulatory Visit (HOSPITAL_COMMUNITY): Payer: Self-pay | Admitting: Anesthesiology

## 2016-03-19 DIAGNOSIS — M79604 Pain in right leg: Secondary | ICD-10-CM | POA: Insufficient documentation

## 2016-03-19 DIAGNOSIS — M5441 Lumbago with sciatica, right side: Secondary | ICD-10-CM | POA: Diagnosis not present

## 2016-03-19 DIAGNOSIS — G8929 Other chronic pain: Secondary | ICD-10-CM

## 2016-03-19 DIAGNOSIS — M5442 Lumbago with sciatica, left side: Secondary | ICD-10-CM

## 2016-03-19 DIAGNOSIS — M47896 Other spondylosis, lumbar region: Secondary | ICD-10-CM | POA: Insufficient documentation

## 2016-05-16 ENCOUNTER — Other Ambulatory Visit: Payer: Self-pay | Admitting: Pain Medicine

## 2016-05-16 DIAGNOSIS — M545 Low back pain, unspecified: Secondary | ICD-10-CM

## 2016-05-28 ENCOUNTER — Ambulatory Visit
Admission: RE | Admit: 2016-05-28 | Discharge: 2016-05-28 | Disposition: A | Discharge status: Home / Self Care | Payer: Medicaid Other | Source: Ambulatory Visit | Attending: Pain Medicine | Admitting: Pain Medicine

## 2016-05-28 DIAGNOSIS — M545 Low back pain, unspecified: Secondary | ICD-10-CM

## 2016-06-20 ENCOUNTER — Other Ambulatory Visit: Payer: Self-pay | Admitting: Neurosurgery

## 2016-06-27 ENCOUNTER — Encounter (HOSPITAL_COMMUNITY)
Admission: RE | Admit: 2016-06-27 | Discharge: 2016-06-27 | Disposition: A | Discharge status: Home / Self Care | Payer: Medicaid Other | Source: Ambulatory Visit | Attending: Neurosurgery | Admitting: Neurosurgery

## 2016-06-27 ENCOUNTER — Encounter (HOSPITAL_COMMUNITY): Payer: Self-pay

## 2016-06-27 DIAGNOSIS — Z01812 Encounter for preprocedural laboratory examination: Secondary | ICD-10-CM | POA: Diagnosis not present

## 2016-06-27 HISTORY — DX: Nocturia: R35.1

## 2016-06-27 HISTORY — DX: Anxiety disorder, unspecified: F41.9

## 2016-06-27 HISTORY — DX: Histoplasmosis, unspecified: B39.9

## 2016-06-27 HISTORY — DX: Depression, unspecified: F32.A

## 2016-06-27 HISTORY — DX: Polyneuropathy, unspecified: G62.9

## 2016-06-27 HISTORY — DX: Weakness: R53.1

## 2016-06-27 HISTORY — DX: Chorioretinal disorders in diseases classified elsewhere: H32

## 2016-06-27 HISTORY — DX: Other specified postprocedural states: R11.2

## 2016-06-27 HISTORY — DX: Effusion, unspecified joint: M25.40

## 2016-06-27 HISTORY — DX: Unspecified osteoarthritis, unspecified site: M19.90

## 2016-06-27 HISTORY — DX: Other specified postprocedural states: Z98.890

## 2016-06-27 HISTORY — DX: Major depressive disorder, single episode, unspecified: F32.9

## 2016-06-27 HISTORY — DX: Pain in unspecified joint: M25.50

## 2016-06-27 LAB — CBC WITH DIFFERENTIAL/PLATELET
BASOS ABS: 0.1 10*3/uL (ref 0.0–0.1)
Basophils Relative: 0 %
EOS ABS: 0.3 10*3/uL (ref 0.0–0.7)
EOS PCT: 1 %
HCT: 42.4 % (ref 36.0–46.0)
Hemoglobin: 14.3 g/dL (ref 12.0–15.0)
Lymphocytes Relative: 20 %
Lymphs Abs: 4.3 10*3/uL — ABNORMAL HIGH (ref 0.7–4.0)
MCH: 29.6 pg (ref 26.0–34.0)
MCHC: 33.7 g/dL (ref 30.0–36.0)
MCV: 87.8 fL (ref 78.0–100.0)
Monocytes Absolute: 0.9 10*3/uL (ref 0.1–1.0)
Monocytes Relative: 4 %
Neutro Abs: 15.6 10*3/uL — ABNORMAL HIGH (ref 1.7–7.7)
Neutrophils Relative %: 75 %
PLATELETS: 378 10*3/uL (ref 150–400)
RBC: 4.83 MIL/uL (ref 3.87–5.11)
RDW: 16.6 % — ABNORMAL HIGH (ref 11.5–15.5)
WBC: 21.1 10*3/uL — AB (ref 4.0–10.5)

## 2016-06-27 LAB — HCG, SERUM, QUALITATIVE: PREG SERUM: NEGATIVE

## 2016-06-27 LAB — TYPE AND SCREEN
ABO/RH(D): A POS
Antibody Screen: NEGATIVE

## 2016-06-27 LAB — ABO/RH: ABO/RH(D): A POS

## 2016-06-27 LAB — SURGICAL PCR SCREEN
MRSA, PCR: NEGATIVE
Staphylococcus aureus: NEGATIVE

## 2016-06-27 MED ORDER — CHLORHEXIDINE GLUCONATE CLOTH 2 % EX PADS: 6.0000 | MEDICATED_PAD | Freq: Once | CUTANEOUS | Status: DC

## 2016-06-27 NOTE — Progress Notes (Addendum)
Saw a cardiologist about 1 1/2 yr ago.States no follow up needed.Report in care everywhere from 09/2014  Echo done in care everywhere from 09/2014  Stress test or heart cath denies  EKGdenies in past yr  CXR denies

## 2016-06-27 NOTE — Pre-Procedure Instructions (Signed)
Eber HongCheryl A Crocker  06/27/2016      [facility] [address] - [place], [place] - [address] [place] [address] Phone: [phone] Fax: [phone]    Your procedure is scheduled on Mon, May 21 @ 12:30 PM  Report to [facility] Admitting at 10:30 AM  Call this number if you have problems the morning of surgery:  [phone]   Remember:  Do not eat food or drink liquids after midnight.  Take these medicines the morning of surgery with A SIP OF WATER Celexa(Citalopram),Pain Pill(if needed),and Ativan(Lorazepam)              Stop taking Fish Oil along with any Vitamins or Herbal Medications. No Goody's,BC's,Aleve,Advil,Motrin,Ibuprofen,or Aspirin.   Do not wear jewelry, make-up or nail polish.  Do not wear lotions, powders,perfumes, or deoderant.  Do not shave 48 hours prior to surgery.    Do not bring valuables to the hospital.  [facility] is not responsible for any belongings or valuables.  Contacts, dentures or bridgework may not be worn into surgery.  Leave your suitcase in the car.  After surgery it may be brought to your room.  For patients admitted to the hospital, discharge time will be determined by your treatment team.  Patients discharged the day of surgery will not be allowed to drive home.    [facility]  Before surgery, you can play an important role.  Because skin is not sterile, your skin needs to be as free of germs as possible.  You can reduce the number of germs on you skin by washing with CHG (chlorahexidine gluconate) soap before surgery.  CHG is an antiseptic cleaner which kills germs and bonds with the skin to continue killing germs even after washing.  Please DO NOT use if you have an allergy to CHG or antibacterial soaps.  If your skin becomes reddened/irritated stop using the CHG and inform your nurse when you arrive at Short Stay.  Do not shave (including legs and underarms) for  at least 48 hours prior to the first CHG shower.  You may shave your face.  Please follow these instructions carefully:   1.  Shower with CHG Soap the night before surgery and the                                morning of Surgery.  2.  If you choose to wash your hair, wash your hair first as usual with your       normal shampoo.  3.  After you shampoo, rinse your hair and body thoroughly to remove the                      Shampoo.  4.  Use CHG as you would any other liquid soap.  You can apply chg directly       to the skin and wash gently with scrungie or a clean washcloth.  5.  Apply the CHG Soap to your body ONLY FROM THE NECK DOWN.        Do not use on open wounds or open sores.  Avoid contact with your eyes,       ears, mouth and genitals (private parts).  Wash genitals (private parts)       with your normal soap.  6.  Wash thoroughly, paying special attention to  the area where your surgery        will be performed.  7.  Thoroughly rinse your body with warm water from the neck down.  8.  DO NOT shower/wash with your normal soap after using and rinsing off       the CHG Soap.  9.  Pat yourself dry with a clean towel.            10.  Wear clean pajamas.            11.  Place clean sheets on your bed the night of your first shower and do not        sleep with pets.  Day of Surgery  Do not apply any lotions/deoderants the morning of surgery.  Please wear clean clothes to the hospital/surgery center.    Please read over the following fact sheets that you were given. Pain Booklet, Coughing and Deep Breathing, MRSA Information and Surgical Site Infection Prevention

## 2016-06-28 NOTE — Progress Notes (Signed)
Anesthesia Chart Review:  Pt is a 51 year old female scheduled for L4-5 PLIF on 07/01/2016 with Julio SicksHenry Pool, MD  - PCP is Kathlen BrunswickMarsha White, NP  PMH includes: Ocular histoplasmosis, legally blind, post-op N/V. Current smoker. BMI 40.  Medications reviewed.   Preoperative labs reviewed.  WBC 21.1.   EKG 06/12/15: Sinus rhythm. ST elev, probable normal early repol pattern. Baseline wander in lead(s) V6  Pt does not have chronically elevated WBCs, this is an acute problem. She did not report feeling ill to PAT RN. I do not see any medications that would elevate WBC. I notified Erie NoeVanessa in Dr. Lindalou HosePool's office that pt would need to have increased WBCs evaluated before surgery.   Rica Mastngela Shelby Peltz, FNP-BC [facility]/Anesthesiology Phone: [phone] 06/28/2016 9:58 AM

## 2016-07-01 ENCOUNTER — Inpatient Hospital Stay (HOSPITAL_COMMUNITY): Payer: Medicaid Other | Admitting: Emergency Medicine

## 2016-07-01 ENCOUNTER — Encounter (HOSPITAL_COMMUNITY): Payer: Self-pay | Admitting: General Practice

## 2016-07-01 ENCOUNTER — Inpatient Hospital Stay (HOSPITAL_COMMUNITY): Payer: Medicaid Other

## 2016-07-01 ENCOUNTER — Inpatient Hospital Stay (HOSPITAL_COMMUNITY)
Admission: RE | Admit: 2016-07-01 | Discharge: 2016-07-02 | DRG: 455 | Disposition: A | Discharge status: Home / Self Care | Payer: Medicaid Other | Source: Ambulatory Visit | Attending: Neurosurgery | Admitting: Neurosurgery

## 2016-07-01 ENCOUNTER — Inpatient Hospital Stay (HOSPITAL_COMMUNITY)
Admission: RE | Disposition: A | Discharge status: Home / Self Care | Payer: Self-pay | Source: Ambulatory Visit | Attending: Neurosurgery

## 2016-07-01 DIAGNOSIS — M545 Low back pain: Secondary | ICD-10-CM | POA: Diagnosis present

## 2016-07-01 DIAGNOSIS — Z886 Allergy status to analgesic agent status: Secondary | ICD-10-CM

## 2016-07-01 DIAGNOSIS — M4316 Spondylolisthesis, lumbar region: Principal | ICD-10-CM | POA: Diagnosis present

## 2016-07-01 DIAGNOSIS — F17210 Nicotine dependence, cigarettes, uncomplicated: Secondary | ICD-10-CM | POA: Diagnosis present

## 2016-07-01 DIAGNOSIS — F419 Anxiety disorder, unspecified: Secondary | ICD-10-CM | POA: Diagnosis present

## 2016-07-01 DIAGNOSIS — Z419 Encounter for procedure for purposes other than remedying health state, unspecified: Secondary | ICD-10-CM

## 2016-07-01 DIAGNOSIS — M48061 Spinal stenosis, lumbar region without neurogenic claudication: Secondary | ICD-10-CM | POA: Diagnosis present

## 2016-07-01 DIAGNOSIS — Z79891 Long term (current) use of opiate analgesic: Secondary | ICD-10-CM

## 2016-07-01 DIAGNOSIS — H548 Legal blindness, as defined in USA: Secondary | ICD-10-CM | POA: Diagnosis present

## 2016-07-01 DIAGNOSIS — Z79899 Other long term (current) drug therapy: Secondary | ICD-10-CM

## 2016-07-01 DIAGNOSIS — F329 Major depressive disorder, single episode, unspecified: Secondary | ICD-10-CM | POA: Diagnosis present

## 2016-07-01 DIAGNOSIS — M4310 Spondylolisthesis, site unspecified: Secondary | ICD-10-CM | POA: Diagnosis present

## 2016-07-01 LAB — CBC
HCT: 42.4 % (ref 36.0–46.0)
Hemoglobin: 14 g/dL (ref 12.0–15.0)
MCH: 29.1 pg (ref 26.0–34.0)
MCHC: 33 g/dL (ref 30.0–36.0)
MCV: 88.1 fL (ref 78.0–100.0)
PLATELETS: 384 10*3/uL (ref 150–400)
RBC: 4.81 MIL/uL (ref 3.87–5.11)
RDW: 16.4 % — AB (ref 11.5–15.5)
WBC: 12.3 10*3/uL — AB (ref 4.0–10.5)

## 2016-07-01 LAB — DIFFERENTIAL
BASOS PCT: 0 %
Basophils Absolute: 0.1 10*3/uL (ref 0.0–0.1)
EOS PCT: 3 %
Eosinophils Absolute: 0.3 10*3/uL (ref 0.0–0.7)
Lymphocytes Relative: 34 %
Lymphs Abs: 4.1 10*3/uL — ABNORMAL HIGH (ref 0.7–4.0)
MONO ABS: 0.7 10*3/uL (ref 0.1–1.0)
Monocytes Relative: 6 %
NEUTROS ABS: 7.1 10*3/uL (ref 1.7–7.7)
NEUTROS PCT: 57 %

## 2016-07-01 SURGERY — POSTERIOR LUMBAR FUSION 1 LEVEL
Anesthesia: General | Site: Back

## 2016-07-01 MED ORDER — DEXAMETHASONE SODIUM PHOSPHATE 10 MG/ML IJ SOLN
INTRAMUSCULAR | Status: AC
  Filled 2016-07-01: qty 1

## 2016-07-01 MED ORDER — SODIUM CHLORIDE 0.9 % IV SOLN: 250.0000 mL | INTRAVENOUS | Status: DC

## 2016-07-01 MED ORDER — ONDANSETRON HCL 4 MG/2ML IJ SOLN
4.0000 mg | Freq: Four times a day (QID) | INTRAMUSCULAR | Status: DC | PRN
  Administered 2016-07-01: 4 mg via INTRAVENOUS
  Filled 2016-07-01: qty 2

## 2016-07-01 MED ORDER — ONDANSETRON HCL 4 MG PO TABS: 4.0000 mg | ORAL_TABLET | Freq: Four times a day (QID) | ORAL | Status: DC | PRN

## 2016-07-01 MED ORDER — SODIUM CHLORIDE 0.9% FLUSH: 3.0000 mL | INTRAVENOUS | Status: DC | PRN

## 2016-07-01 MED ORDER — PROPOFOL 10 MG/ML IV BOLUS
INTRAVENOUS | Status: AC
  Filled 2016-07-01: qty 20

## 2016-07-01 MED ORDER — HYDROMORPHONE HCL 1 MG/ML IJ SOLN
0.5000 mg | INTRAMUSCULAR | Status: DC | PRN
  Administered 2016-07-01: 0.5 mg via INTRAVENOUS
  Filled 2016-07-01: qty 1

## 2016-07-01 MED ORDER — HYDROCODONE-ACETAMINOPHEN 5-325 MG PO TABS
ORAL_TABLET | ORAL | Status: DC
Start: 2016-07-01 — End: 2016-07-01
  Filled 2016-07-01: qty 2

## 2016-07-01 MED ORDER — DIAZEPAM 5 MG PO TABS
ORAL_TABLET | ORAL | Status: AC
  Administered 2016-07-01: 10 mg via ORAL
  Filled 2016-07-01: qty 1

## 2016-07-01 MED ORDER — LACTATED RINGERS IV SOLN: INTRAVENOUS | Status: DC

## 2016-07-01 MED ORDER — PHENYLEPHRINE HCL 10 MG/ML IJ SOLN
INTRAMUSCULAR | Status: DC | PRN
  Administered 2016-07-01: 25 ug/min via INTRAVENOUS

## 2016-07-01 MED ORDER — FENTANYL CITRATE (PF) 250 MCG/5ML IJ SOLN
INTRAMUSCULAR | Status: AC
  Filled 2016-07-01: qty 5

## 2016-07-01 MED ORDER — MENTHOL 3 MG MT LOZG: 1.0000 | LOZENGE | OROMUCOSAL | Status: DC | PRN

## 2016-07-01 MED ORDER — SODIUM CHLORIDE 0.9% FLUSH
3.0000 mL | Freq: Two times a day (BID) | INTRAVENOUS | Status: DC
  Administered 2016-07-01: 3 mL via INTRAVENOUS

## 2016-07-01 MED ORDER — MENOPAUSE FORMULA PO TABS: ORAL_TABLET | Freq: Every day | ORAL | Status: DC

## 2016-07-01 MED ORDER — ALBUTEROL SULFATE HFA 108 (90 BASE) MCG/ACT IN AERS
INHALATION_SPRAY | RESPIRATORY_TRACT | Status: AC
  Filled 2016-07-01: qty 6.7

## 2016-07-01 MED ORDER — DIAZEPAM 5 MG PO TABS
ORAL_TABLET | ORAL | Status: AC
  Administered 2016-07-02: 10 mg via ORAL
  Filled 2016-07-01: qty 1

## 2016-07-01 MED ORDER — 0.9 % SODIUM CHLORIDE (POUR BTL) OPTIME
TOPICAL | Status: DC | PRN
  Administered 2016-07-01: 1000 mL

## 2016-07-01 MED ORDER — LIDOCAINE 2% (20 MG/ML) 5 ML SYRINGE
INTRAMUSCULAR | Status: AC
  Filled 2016-07-01: qty 5

## 2016-07-01 MED ORDER — FENTANYL CITRATE (PF) 100 MCG/2ML IJ SOLN
25.0000 ug | INTRAMUSCULAR | Status: DC | PRN
  Administered 2016-07-01 (×3): 50 ug via INTRAVENOUS

## 2016-07-01 MED ORDER — CEFAZOLIN SODIUM-DEXTROSE 2-4 GM/100ML-% IV SOLN
2.0000 g | INTRAVENOUS | Status: AC
  Administered 2016-07-01: 2 g via INTRAVENOUS

## 2016-07-01 MED ORDER — PROPOFOL 10 MG/ML IV BOLUS
INTRAVENOUS | Status: DC | PRN
  Administered 2016-07-01: 20 mg via INTRAVENOUS
  Administered 2016-07-01: 180 mg via INTRAVENOUS

## 2016-07-01 MED ORDER — SODIUM CHLORIDE 0.9 % IR SOLN
Status: DC | PRN
  Administered 2016-07-01: 500 mL

## 2016-07-01 MED ORDER — THROMBIN 20000 UNITS EX SOLR
CUTANEOUS | Status: DC | PRN
  Administered 2016-07-01: 20 mL via TOPICAL

## 2016-07-01 MED ORDER — THROMBIN 20000 UNITS EX SOLR
CUTANEOUS | Status: AC
  Filled 2016-07-01: qty 20000

## 2016-07-01 MED ORDER — FENTANYL CITRATE (PF) 100 MCG/2ML IJ SOLN
INTRAMUSCULAR | Status: AC
  Filled 2016-07-01: qty 2

## 2016-07-01 MED ORDER — BUPIVACAINE HCL (PF) 0.25 % IJ SOLN
INTRAMUSCULAR | Status: AC
  Filled 2016-07-01: qty 30

## 2016-07-01 MED ORDER — DIAZEPAM 5 MG PO TABS
5.0000 mg | ORAL_TABLET | Freq: Four times a day (QID) | ORAL | Status: DC | PRN
  Administered 2016-07-01 – 2016-07-02 (×2): 10 mg via ORAL
  Administered 2016-07-02: 5 mg via ORAL
  Filled 2016-07-01: qty 2
  Filled 2016-07-01: qty 1

## 2016-07-01 MED ORDER — MEPERIDINE HCL 25 MG/ML IJ SOLN: 6.2500 mg | INTRAMUSCULAR | Status: DC | PRN

## 2016-07-01 MED ORDER — PHENOL 1.4 % MT LIQD: 1.0000 | OROMUCOSAL | Status: DC | PRN

## 2016-07-01 MED ORDER — MIDAZOLAM HCL 2 MG/2ML IJ SOLN
INTRAMUSCULAR | Status: AC
  Filled 2016-07-01: qty 2

## 2016-07-01 MED ORDER — LIDOCAINE HCL (CARDIAC) 20 MG/ML IV SOLN
INTRAVENOUS | Status: DC | PRN
Start: 2016-07-01 — End: 2016-07-01
  Administered 2016-07-01: 100 mg via INTRAVENOUS

## 2016-07-01 MED ORDER — ALBUTEROL SULFATE HFA 108 (90 BASE) MCG/ACT IN AERS
INHALATION_SPRAY | RESPIRATORY_TRACT | Status: DC | PRN
  Administered 2016-07-01 (×5): 2 via RESPIRATORY_TRACT

## 2016-07-01 MED ORDER — LORAZEPAM 0.5 MG PO TABS
0.5000 mg | ORAL_TABLET | Freq: Two times a day (BID) | ORAL | Status: DC
  Administered 2016-07-01 – 2016-07-02 (×2): 0.5 mg via ORAL
  Filled 2016-07-01 (×2): qty 1

## 2016-07-01 MED ORDER — SUGAMMADEX SODIUM 200 MG/2ML IV SOLN
INTRAVENOUS | Status: DC | PRN
  Administered 2016-07-01: 190 mg via INTRAVENOUS

## 2016-07-01 MED ORDER — ROCURONIUM BROMIDE 100 MG/10ML IV SOLN
INTRAVENOUS | Status: DC | PRN
  Administered 2016-07-01 (×2): 50 mg via INTRAVENOUS

## 2016-07-01 MED ORDER — BUPIVACAINE HCL (PF) 0.25 % IJ SOLN
INTRAMUSCULAR | Status: DC | PRN
  Administered 2016-07-01: 20 mL

## 2016-07-01 MED ORDER — CITALOPRAM HYDROBROMIDE 20 MG PO TABS
20.0000 mg | ORAL_TABLET | Freq: Every day | ORAL | Status: DC
  Administered 2016-07-02: 20 mg via ORAL
  Filled 2016-07-01 (×2): qty 1

## 2016-07-01 MED ORDER — HYDROCODONE-ACETAMINOPHEN 10-325 MG PO TABS: 1.0000 | ORAL_TABLET | Freq: Every day | ORAL | Status: DC

## 2016-07-01 MED ORDER — METOCLOPRAMIDE HCL 5 MG/ML IJ SOLN: 10.0000 mg | Freq: Once | INTRAMUSCULAR | Status: DC | PRN

## 2016-07-01 MED ORDER — ONDANSETRON HCL 4 MG/2ML IJ SOLN
INTRAMUSCULAR | Status: DC | PRN
  Administered 2016-07-01: 4 mg via INTRAVENOUS

## 2016-07-01 MED ORDER — HYPROMELLOSE (GONIOSCOPIC) 2.5 % OP SOLN
Freq: Three times a day (TID) | OPHTHALMIC | Status: DC | PRN
  Filled 2016-07-01: qty 15

## 2016-07-01 MED ORDER — EPHEDRINE SULFATE 50 MG/ML IJ SOLN
INTRAMUSCULAR | Status: DC | PRN
  Administered 2016-07-01 (×2): 10 mg via INTRAVENOUS
  Administered 2016-07-01: 5 mg via INTRAVENOUS
  Administered 2016-07-01 (×2): 10 mg via INTRAVENOUS

## 2016-07-01 MED ORDER — FENTANYL CITRATE (PF) 100 MCG/2ML IJ SOLN
INTRAMUSCULAR | Status: DC | PRN
  Administered 2016-07-01: 50 ug via INTRAVENOUS
  Administered 2016-07-01 (×2): 100 ug via INTRAVENOUS
  Administered 2016-07-01 (×10): 50 ug via INTRAVENOUS

## 2016-07-01 MED ORDER — ESTROVEN MENOPAUSE RELIEF PO CAPS: 1.0000 | ORAL_CAPSULE | Freq: Every day | ORAL | Status: DC

## 2016-07-01 MED ORDER — NICOTINE 21 MG/24HR TD PT24
21.0000 mg | MEDICATED_PATCH | Freq: Every day | TRANSDERMAL | Status: DC
  Administered 2016-07-01 – 2016-07-02 (×2): 21 mg via TRANSDERMAL
  Filled 2016-07-01 (×2): qty 1

## 2016-07-01 MED ORDER — PHENYLEPHRINE HCL 10 MG/ML IJ SOLN
INTRAMUSCULAR | Status: AC
  Filled 2016-07-01: qty 1

## 2016-07-01 MED ORDER — CEFAZOLIN SODIUM-DEXTROSE 1-4 GM/50ML-% IV SOLN
1.0000 g | Freq: Three times a day (TID) | INTRAVENOUS | Status: AC
  Administered 2016-07-01 – 2016-07-02 (×2): 1 g via INTRAVENOUS
  Filled 2016-07-01 (×2): qty 50

## 2016-07-01 MED ORDER — DEXAMETHASONE SODIUM PHOSPHATE 10 MG/ML IJ SOLN
10.0000 mg | INTRAMUSCULAR | Status: AC
  Administered 2016-07-01: 10 mg via INTRAVENOUS

## 2016-07-01 MED ORDER — HYDROCODONE-ACETAMINOPHEN 10-325 MG PO TABS
1.0000 | ORAL_TABLET | ORAL | Status: DC | PRN
  Administered 2016-07-01 – 2016-07-02 (×4): 2 via ORAL
  Filled 2016-07-01 (×4): qty 2

## 2016-07-01 MED ORDER — VANCOMYCIN HCL 1000 MG IV SOLR
INTRAVENOUS | Status: AC
  Filled 2016-07-01: qty 1000

## 2016-07-01 MED ORDER — VANCOMYCIN HCL 1000 MG IV SOLR
INTRAVENOUS | Status: DC | PRN
  Administered 2016-07-01: 1000 mg via TOPICAL

## 2016-07-01 MED ORDER — LACTATED RINGERS IV SOLN
INTRAVENOUS | Status: DC
  Administered 2016-07-01 (×2): via INTRAVENOUS

## 2016-07-01 MED ORDER — FENTANYL CITRATE (PF) 100 MCG/2ML IJ SOLN
INTRAMUSCULAR | Status: AC
  Administered 2016-07-01: 50 ug via INTRAVENOUS
  Filled 2016-07-01: qty 2

## 2016-07-01 MED ORDER — MIDAZOLAM HCL 5 MG/5ML IJ SOLN
INTRAMUSCULAR | Status: DC | PRN
Start: 2016-07-01 — End: 2016-07-01
  Administered 2016-07-01: 2 mg via INTRAVENOUS

## 2016-07-01 MED ORDER — PHENYLEPHRINE HCL 10 MG/ML IJ SOLN
INTRAMUSCULAR | Status: DC | PRN
  Administered 2016-07-01 (×3): 80 ug via INTRAVENOUS

## 2016-07-01 MED ORDER — ROCURONIUM BROMIDE 10 MG/ML (PF) SYRINGE
PREFILLED_SYRINGE | INTRAVENOUS | Status: AC
  Filled 2016-07-01: qty 5

## 2016-07-01 SURGICAL SUPPLY — 65 items
BAG DECANTER FOR FLEXI CONT (MISCELLANEOUS) ×3 IMPLANT
BENZOIN TINCTURE PRP APPL 2/3 (GAUZE/BANDAGES/DRESSINGS) ×3 IMPLANT
BLADE CLIPPER SURG (BLADE) IMPLANT
BUR CUTTER 7.0 ROUND (BURR) IMPLANT
BUR MATCHSTICK NEURO 3.0 LAGG (BURR) ×3 IMPLANT
CANISTER SUCT 3000ML PPV (MISCELLANEOUS) ×3 IMPLANT
CAP LCK SPNE (Orthopedic Implant) ×4 IMPLANT
CAP LOCK SPINE RADIUS (Orthopedic Implant) ×4 IMPLANT
CAP LOCKING (Orthopedic Implant) ×8 IMPLANT
CARTRIDGE OIL MAESTRO DRILL (MISCELLANEOUS) ×1 IMPLANT
CLOSURE WOUND 1/2 X4 (GAUZE/BANDAGES/DRESSINGS) ×1
CONT SPEC 4OZ CLIKSEAL STRL BL (MISCELLANEOUS) ×3 IMPLANT
COVER BACK TABLE 60X90IN (DRAPES) ×3 IMPLANT
DECANTER SPIKE VIAL GLASS SM (MISCELLANEOUS) ×3 IMPLANT
DERMABOND ADVANCED (GAUZE/BANDAGES/DRESSINGS) ×2
DERMABOND ADVANCED .7 DNX12 (GAUZE/BANDAGES/DRESSINGS) ×1 IMPLANT
DEVICE INTERBODY ELEVATE 23X9 (Cage) ×6 IMPLANT
DIFFUSER DRILL AIR PNEUMATIC (MISCELLANEOUS) ×3 IMPLANT
DRAPE C-ARM 42X72 X-RAY (DRAPES) ×6 IMPLANT
DRAPE HALF SHEET 40X57 (DRAPES) IMPLANT
DRAPE LAPAROTOMY 100X72X124 (DRAPES) ×3 IMPLANT
DRAPE POUCH INSTRU U-SHP 10X18 (DRAPES) ×3 IMPLANT
DRAPE SURG 17X23 STRL (DRAPES) ×12 IMPLANT
DRSG OPSITE POSTOP 4X6 (GAUZE/BANDAGES/DRESSINGS) ×3 IMPLANT
DURAPREP 26ML APPLICATOR (WOUND CARE) ×3 IMPLANT
ELECT REM PT RETURN 9FT ADLT (ELECTROSURGICAL) ×3
ELECTRODE REM PT RTRN 9FT ADLT (ELECTROSURGICAL) ×1 IMPLANT
EVACUATOR 1/8 PVC DRAIN (DRAIN) IMPLANT
GAUZE SPONGE 4X4 12PLY STRL (GAUZE/BANDAGES/DRESSINGS) ×3 IMPLANT
GAUZE SPONGE 4X4 16PLY XRAY LF (GAUZE/BANDAGES/DRESSINGS) IMPLANT
GLOVE BIOGEL PI IND STRL 7.0 (GLOVE) ×1 IMPLANT
GLOVE BIOGEL PI IND STRL 7.5 (GLOVE) ×1 IMPLANT
GLOVE BIOGEL PI INDICATOR 7.0 (GLOVE) ×2
GLOVE BIOGEL PI INDICATOR 7.5 (GLOVE) ×2
GLOVE ECLIPSE 7.5 STRL STRAW (GLOVE) ×15 IMPLANT
GLOVE ECLIPSE 9.0 STRL (GLOVE) ×6 IMPLANT
GLOVE EXAM NITRILE LRG STRL (GLOVE) IMPLANT
GLOVE EXAM NITRILE XL STR (GLOVE) IMPLANT
GLOVE EXAM NITRILE XS STR PU (GLOVE) ×6 IMPLANT
GLOVE SURG SS PI 6.5 STRL IVOR (GLOVE) ×9 IMPLANT
GOWN STRL REUS W/ TWL LRG LVL3 (GOWN DISPOSABLE) IMPLANT
GOWN STRL REUS W/ TWL XL LVL3 (GOWN DISPOSABLE) ×1 IMPLANT
GOWN STRL REUS W/TWL 2XL LVL3 (GOWN DISPOSABLE) ×3 IMPLANT
GOWN STRL REUS W/TWL LRG LVL3 (GOWN DISPOSABLE)
GOWN STRL REUS W/TWL XL LVL3 (GOWN DISPOSABLE) ×2
KIT BASIN OR (CUSTOM PROCEDURE TRAY) ×3 IMPLANT
KIT ROOM TURNOVER OR (KITS) ×3 IMPLANT
MILL MEDIUM DISP (BLADE) ×3 IMPLANT
NEEDLE HYPO 22GX1.5 SAFETY (NEEDLE) ×3 IMPLANT
NS IRRIG 1000ML POUR BTL (IV SOLUTION) ×3 IMPLANT
OIL CARTRIDGE MAESTRO DRILL (MISCELLANEOUS) ×3
PACK LAMINECTOMY NEURO (CUSTOM PROCEDURE TRAY) ×3 IMPLANT
ROD RADIUS 45MM (Rod) ×4 IMPLANT
ROD SPNL 45X5.5XNS TI RDS (Rod) ×2 IMPLANT
SCREW 5.75X40M (Screw) ×12 IMPLANT
SPONGE SURGIFOAM ABS GEL 100 (HEMOSTASIS) ×3 IMPLANT
STRIP CLOSURE SKIN 1/2X4 (GAUZE/BANDAGES/DRESSINGS) ×2 IMPLANT
SUT VIC AB 0 CT1 18XCR BRD8 (SUTURE) ×2 IMPLANT
SUT VIC AB 0 CT1 8-18 (SUTURE) ×4
SUT VIC AB 2-0 CT1 18 (SUTURE) ×3 IMPLANT
SUT VIC AB 3-0 SH 8-18 (SUTURE) ×3 IMPLANT
TOWEL GREEN STERILE (TOWEL DISPOSABLE) ×3 IMPLANT
TOWEL GREEN STERILE FF (TOWEL DISPOSABLE) ×2 IMPLANT
TRAY FOLEY W/METER SILVER 16FR (SET/KITS/TRAYS/PACK) ×3 IMPLANT
WATER STERILE IRR 1000ML POUR (IV SOLUTION) ×3 IMPLANT

## 2016-07-01 NOTE — Anesthesia Preprocedure Evaluation (Signed)
Anesthesia Evaluation  Patient identified by MRN, date of birth, ID band Patient awake    Reviewed: Allergy & Precautions, NPO status , Patient's Chart, lab work & pertinent test results  History of Anesthesia Complications (+) PONV  Airway Mallampati: II  TM Distance: >3 FB Neck ROM: Full    Dental no notable dental hx. (+) Upper Dentures   Pulmonary Current Smoker,    Pulmonary exam normal breath sounds clear to auscultation       Cardiovascular negative cardio ROS Normal cardiovascular exam Rhythm:Regular Rate:Normal     Neuro/Psych negative neurological ROS  negative psych ROS   GI/Hepatic negative GI ROS, Neg liver ROS,   Endo/Other  negative endocrine ROS  Renal/GU negative Renal ROS  negative genitourinary   Musculoskeletal negative musculoskeletal ROS (+)   Abdominal   Peds negative pediatric ROS (+)  Hematology negative hematology ROS (+)   Anesthesia Other Findings   Reproductive/Obstetrics negative OB ROS                            Anesthesia Physical Anesthesia Plan  ASA: II  Anesthesia Plan: General   Post-op Pain Management:    Induction: Intravenous  Airway Management Planned: Oral ETT  Additional Equipment:   Intra-op Plan:   Post-operative Plan: Extubation in OR  Informed Consent: I have reviewed the patients History and Physical, chart, labs and discussed the procedure including the risks, benefits and alternatives for the proposed anesthesia with the patient or authorized representative who has indicated his/her understanding and acceptance.   Dental advisory given  Plan Discussed with: CRNA  Anesthesia Plan Comments:         Anesthesia Quick Evaluation

## 2016-07-01 NOTE — Progress Notes (Signed)
PHARMACIST - PHYSICIAN ORDER COMMUNICATION  CONCERNING: P&T Medication Policy on Herbal Medications  DESCRIPTION:  This patient's order for:  menopause formula and estroven menopause relief have been noted.  This product(s) is classified as an "herbal" or natural product. Due to a lack of definitive safety studies or FDA approval, nonstandard manufacturing practices, plus the potential risk of unknown drug-drug interactions while on inpatient medications, the Pharmacy and Therapeutics Committee does not permit the use of "herbal" or natural products of this type within [facility].   ACTION TAKEN: The pharmacy department is unable to verify this order at this time.  Please reevaluate patient's clinical condition at discharge and address if the herbal or natural product(s) should be resumed at that time.

## 2016-07-01 NOTE — H&P (Signed)
Teresa Yu is an 51 y.o. female.   Chief Complaint: Back pain HPI: 51 year old female with chronic and progressive lower back pain with intermittent radiation into both lower extremities. Workup demonstrates evidence of an unstable degenerative spondylolisthesis at L4-5 with marked facet arthropathy and distraction. Patient presents now for lumbar decompression and fusion at L4-5  Past Medical History:  Diagnosis Date  . Anxiety    takes Ativan daily  . Arthritis   . Depression    takes Celexa daily  . Headache    states she has had a headache for 2 days  . Joint pain   . Joint swelling   . Legally blind   . Nocturia   . Ocular histoplasmosis    uses drops as needed  . Peripheral neuropathy   . PONV (postoperative nausea and vomiting)   . Weakness    numbness and tingling     Past Surgical History:  Procedure Laterality Date  . CATARACT EXTRACTION W/PHACO Left 08/22/2014   Procedure: CATARACT EXTRACTION PHACO AND INTRAOCULAR LENS PLACEMENT LEFT EYE;  Surgeon: [name], MD;  Location: [facility];  Service: Ophthalmology;  Laterality: Left;  CDE 24.24  . CESAREAN SECTION  1984  . EYE SURGERY Bilateral 1995, 1998   macular generation  . EYE SURGERY Bilateral    x 2  . MYRINGOTOMY WITH TUBE PLACEMENT     as a child  . TUBAL LIGATION      Family History  Problem Relation Age of Onset  . Cancer Brother   . Seizures Neg Hx   . Multiple sclerosis Neg Hx    Social History:  reports that she has been smoking Cigarettes.  She has a 36.00 pack-year smoking history. She has never used smokeless tobacco. She reports that she does not drink alcohol or use drugs.  Allergies:  Allergies  Allergen Reactions  . Asa [Aspirin] Rash    In large quantities    Medications Prior to Admission  Medication Sig Dispense Refill  . Black Cohosh-SoyIsoflav-Magnol (ESTROVEN MENOPAUSE RELIEF) CAPS Take 1 capsule by mouth daily.    . Carboxymethylcellulose Sodium (LUBRICANT EYE DROPS OP)  Place 1 drop into both eyes 3 (three) times daily as needed (for dry eyes).    . citalopram (CELEXA) 20 MG tablet Take 20 mg by mouth daily.    Marland Kitchen. HYDROcodone-acetaminophen (NORCO) 10-325 MG tablet Take 1 tablet by mouth daily.    Marland Kitchen. LORazepam (ATIVAN) 0.5 MG tablet Take 0.5 mg by mouth 2 (two) times daily.     . Omega-3 Fatty Acids (FISH OIL PO) Take 2 capsules by mouth daily.    . Nutritional Supplements (MENOPAUSE FORMULA PO) Take 1 packet by mouth daily.      Results for orders placed or performed during the hospital encounter of 07/01/16 (from the past 48 hour(s))  CBC     Status: Abnormal   Collection Time: 07/01/16 10:49 AM  Result Value Ref Range   WBC 12.3 (H) 4.0 - 10.5 K/uL   RBC 4.81 3.87 - 5.11 MIL/uL   Hemoglobin 14.0 12.0 - 15.0 g/dL   HCT 16.142.4 09.636.0 - 04.546.0 %   MCV 88.1 78.0 - 100.0 fL   MCH 29.1 26.0 - 34.0 pg   MCHC 33.0 30.0 - 36.0 g/dL   RDW 40.916.4 (H) 81.111.5 - 91.415.5 %   Platelets 384 150 - 400 K/uL  Differential     Status: Abnormal   Collection Time: 07/01/16 10:49 AM  Result Value Ref Range  Neutrophils Relative % 57 %   Neutro Abs 7.1 1.7 - 7.7 K/uL   Lymphocytes Relative 34 %   Lymphs Abs 4.1 (H) 0.7 - 4.0 K/uL   Monocytes Relative 6 %   Monocytes Absolute 0.7 0.1 - 1.0 K/uL   Eosinophils Relative 3 %   Eosinophils Absolute 0.3 0.0 - 0.7 K/uL   Basophils Relative 0 %   Basophils Absolute 0.1 0.0 - 0.1 K/uL   No results found.  Pertinent items noted in HPI and remainder of comprehensive ROS otherwise negative.  Blood pressure (!) 128/59, pulse 65, temperature 98.3 F (36.8 C), temperature source Oral, resp. rate 20, height 5' (1.524 m), weight 93.5 kg (206 lb 1.6 oz), SpO2 98 %.  Patient is awake and alert. She is oriented and reasonably appropriate. Cranial nerve function with diminished central vision bilaterally otherwise intact. Motor examination 5/5 bilaterally. Sensory examination nonfocal. Deep tendon versus a normal active. No evidence of long track  signs. Gait is antalgic and somewhat shuffling. No evidence of long track signs. Examination head ears eyes and throat is unremarkable. Chest and abdomen are benign. Extremities are free from injury deformity. Assessment/Plan L4-L5 degenerative spondylolisthesis with instability and stenosis. Plan bilateral L4-5 decompression and fusion. Risks and benefits been explained. Patient wishes to proceed.  Teresa Yu A 07/01/2016, 12:07 PM

## 2016-07-01 NOTE — Anesthesia Procedure Notes (Signed)
Procedure Name: Intubation Date/Time: 07/01/2016 12:46 PM Performed by: Rosiland OzMEYERS, Luann Aspinwall Pre-anesthesia Checklist: Patient identified, Emergency Drugs available, Suction available, Patient being monitored and Timeout performed Patient Re-evaluated:Patient Re-evaluated prior to inductionOxygen Delivery Method: Circle system utilized Preoxygenation: Pre-oxygenation with 100% oxygen Intubation Type: IV induction Ventilation: Mask ventilation without difficulty Laryngoscope Size: Miller and 3 Grade View: Grade I Tube type: Oral Tube size: 7.0 mm Number of attempts: 1 Airway Equipment and Method: Stylet Placement Confirmation: ETT inserted through vocal cords under direct vision,  positive ETCO2 and breath sounds checked- equal and bilateral Secured at: 21 cm Tube secured with: Tape Dental Injury: Teeth and Oropharynx as per pre-operative assessment

## 2016-07-01 NOTE — Op Note (Signed)
Date of procedure: 07/01/2016  Date of dictation: Same  Service: Neurosurgery  Preoperative diagnosis: Grade 1 L4-5 unstable degenerative spondylolisthesis with stenosis and neurogenic claudication  Postoperative diagnosis: Same  Procedure Name: Bilateral L4-L5 decompressive laminotomies with bilateral L4 and L5 decompressive foraminotomies, more than will be required for simple interbody fusion alone  L4-L5 posterior lumbar interbody fusion utilizing interbody expandable cages and locally harvested autograft  L4-L5 posterior lateral arthrodesis utilizing nonsegmental pedicle screw fixation and local autograft  Surgeon:Octavian Godek A.Greydis Stlouis, M.D.  Asst. Surgeon: Lovell SheehanJenkins  Anesthesia: General  Indication: 51 year old female with severe back and bilateral lower extremity symptoms failing conservative management. Workup demonstrates evidence of a grade 1 L4-5 spondylolisthesis with stenosis and evidence of instability. Patient presents now for decompression and fusion in hopes of improving her symptoms.  Operative note: After induction of anesthesia, patient positioned prone on the Wilson frame and properly padded. Lumbar region prepped and draped sterilely. Incision made overlying L4-5. Dissection performed bilaterally. Retractor placed. Fluoroscopy used. Levels confirmed. Decompressive laminotomies performed using [facility] high-speed drill to remove the inferior two thirds of the lamina of L4 the entire inferior facet and pars of L4 bilaterally. The superior aspect of the L5 lamina and the majority L5 superior facet were also resected. When inflating elevated and resected. Foraminotomies performed on the course L4 and L5 nerve root bilaterally. Epidural venous plexus was coagulated and cut. Bilateral discectomy was then performed. Displaced then prepared for interbody fusion. With the distractor placed in the patient's right side thecal sac and nerve roots. On the left side. Disc  space scraped of all soft tissue. A 9 mm standard Medtronic expandable cage was then packed in place and expanded to its full extent. This cage was previously packed with locally harvested autograft. Distractor is moving patient's right side. Thecal sac and nerve respect on the right side. Displaced prepared for interbody fusion. Morselized autograft packed into the interspace. A second cage packed with autograft was then packed into place and expanded to its full extent. Pedicles of L4 and L5 were then identified using surface landmarks and intraoperative fluoroscopy. Superficial bone overlying the pedicle was removed using high-speed drill. Pedicles and probed using pedicle protocol track was then tapped with a screw. Each Hole was probed and found to be solidly within the bone. 5.75 mm radius. Screws distractor medical replaced bilaterally at L4 and L5. Final images revealed good position of the cages and the hardware the proper operative level with normal alignment spine. Wounds irrigated with MX solution. Gelfoam was placed topically for hemostasis. Transverse processes and residual facets were decorticated. Morselized autograft was packed posterior lateral. Short segment titanium rod placed over this. The L4 and L5. Locking caps placed over the screw heads. Locking caps were engaged. Vancomycin powder was placed in deep wound space. Wounds closed in layers of Vicryl sutures. Steri-Strips and sterile dressing were applied. No apparent complications. Patient tolerated the procedure well and she returns to the recovery room postoperatively

## 2016-07-01 NOTE — Brief Op Note (Signed)
07/01/2016  2:43 PM  PATIENT:  Teresa Yu  51 y.o. female  PRE-OPERATIVE DIAGNOSIS:  Spondylolisthesis  POST-OPERATIVE DIAGNOSIS:  Spondylolisthesis  PROCEDURE:  Procedure(s): Posterior Lumbar Four-Five Interbody and Fusion (N/A)  SURGEON:  Surgeon(s) and Role:    * Julio SicksPool, Colie Josten, MD - Primary    * Tressie StalkerJenkins, Jeffrey, MD - Assisting  PHYSICIAN ASSISTANT:   ASSISTANTS:    ANESTHESIA:     EBL:  Total I/O In: 1000 [I.V.:1000] Out: 250 [Urine:50; Blood:200]  BLOOD ADMINISTERED:none  DRAINS: none   LOCAL MEDICATIONS USED:  MARCAINE     SPECIMEN:  No Specimen  DISPOSITION OF SPECIMEN:  N/A  COUNTS:  YES  TOURNIQUET:  * No tourniquets in log *  DICTATION: .Dragon Dictation  PLAN OF CARE: Admit to inpatient   PATIENT DISPOSITION:  PACU - hemodynamically stable.   Delay start of Pharmacological VTE agent (>24hrs) due to surgical blood loss or risk of bleeding: yes

## 2016-07-01 NOTE — Anesthesia Postprocedure Evaluation (Addendum)
Anesthesia Post Note  Patient: Teresa Yu  Procedure(s) Performed: Procedure(s) (LRB): Posterior Lumbar Four-Five Interbody and Fusion (N/A)  Patient location during evaluation: PACU Anesthesia Type: General Level of consciousness: awake, awake and alert and oriented Pain management: pain level controlled Vital Signs Assessment: post-procedure vital signs reviewed and stable Respiratory status: spontaneous breathing, nonlabored ventilation and respiratory function stable Cardiovascular status: blood pressure returned to baseline Postop Assessment: no headache Anesthetic complications: no       Last Vitals:  Vitals:   07/01/16 1615 07/01/16 1621  BP:    Pulse: (!) 109 100  Resp: (!) 21 14  Temp:  (!) 36.1 C    Last Pain:  Vitals:   07/01/16 1630  TempSrc:   PainSc: 3                  Bereket Gernert COKER

## 2016-07-01 NOTE — Transfer of Care (Signed)
Immediate Anesthesia Transfer of Care Note  Patient: Teresa Yu  Procedure(s) Performed: Procedure(s): Posterior Lumbar Four-Five Interbody and Fusion (N/A)  Patient Location: PACU  Anesthesia Type:General  Level of Consciousness: awake, alert , oriented and patient cooperative  Airway & Oxygen Therapy: Patient Spontanous Breathing  Post-op Assessment: Report given to RN and Post -op Vital signs reviewed and stable  Post vital signs: Reviewed and stable  Last Vitals:  Vitals:   07/01/16 1454 07/01/16 1455  BP:  135/74  Pulse:  (!) 131  Resp:  16  Temp: 36.5 C     Last Pain:  Vitals:   07/01/16 1122  TempSrc: Oral  PainSc:       Patients Stated Pain Goal: 3 (07/01/16 1106)  Complications: No apparent anesthesia complications

## 2016-07-02 MED ORDER — HYDROCODONE-ACETAMINOPHEN 10-325 MG PO TABS: 1.0000 | ORAL_TABLET | Freq: Every day | ORAL | 0 refills | Status: DC

## 2016-07-02 MED ORDER — DIAZEPAM 5 MG PO TABS: 5.0000 mg | ORAL_TABLET | Freq: Four times a day (QID) | ORAL | 0 refills | Status: DC | PRN

## 2016-07-02 MED FILL — Heparin Sodium (Porcine) Inj 1000 Unit/ML: INTRAMUSCULAR | Qty: 30 | Status: AC

## 2016-07-02 MED FILL — Sodium Chloride IV Soln 0.9%: INTRAVENOUS | Qty: 1000 | Status: AC

## 2016-07-02 NOTE — Discharge Instructions (Signed)

## 2016-07-02 NOTE — Progress Notes (Signed)
Pt received RW and 3-n-1 from [facility] per MD order prior to D/C. Rema FendtAshley Halaina Vanduzer, RN

## 2016-07-02 NOTE — Discharge Summary (Signed)
Physician Discharge Summary  Patient ID: Teresa HongCheryl A Govoni MRN: [id number] DOB/AGE: 50/07/1965 50 y.o.  Admit date: 07/01/2016 Discharge date: 07/02/2016  Admission Diagnoses:  Discharge Diagnoses:  Active Problems:   Degenerative spondylolisthesis   Discharged Condition: good  Hospital Course: Patient admitted to the hospital where she underwent uncomplicated L4-5 decompression infusion. Postoperative she is doing very well. Preoperative back and lower extremity pain much improved. Ambulating without difficulty. Ready for discharge home.  Consults:   Significant Diagnostic Studies:   Treatments:   Discharge Exam: Blood pressure (!) 111/56, pulse 73, temperature 98.7 F (37.1 C), temperature source Oral, resp. rate 20, height 5' (1.524 m), weight 93.5 kg (206 lb 1.6 oz), SpO2 98 %. Awake and alert. Oriented and appropriate. Cranial nerve function intact. Motor examination of her extremities normal. Sensory examination with some mild decreased sensation to pinprick and light touch in her L5 dermatomes bilaterally which is chronic. Wound clean and dry. Chest and abdomen benign.  Disposition: 01-Home or Self Care   Allergies as of 07/02/2016      Reactions   Asa [aspirin] Rash   In large quantities      Medication List    TAKE these medications   citalopram 20 MG tablet Commonly known as:  CELEXA Take 20 mg by mouth daily.   diazepam 5 MG tablet Commonly known as:  VALIUM Take 1-2 tablets (5-10 mg total) by mouth every 6 (six) hours as needed for muscle spasms.   ESTROVEN MENOPAUSE RELIEF Caps Take 1 capsule by mouth daily.   FISH OIL PO Take 2 capsules by mouth daily.   HYDROcodone-acetaminophen 10-325 MG tablet Commonly known as:  NORCO Take 1 tablet by mouth daily.   LORazepam 0.5 MG tablet Commonly known as:  ATIVAN Take 0.5 mg by mouth 2 (two) times daily.   LUBRICANT EYE DROPS OP Place 1 drop into both eyes 3 (three) times daily as needed (for dry eyes).    MENOPAUSE FORMULA PO Take 1 packet by mouth daily.            Durable Medical Equipment        Start     Ordered   07/01/16 1647  DME Walker rolling  Once    Question:  Patient needs a walker to treat with the following condition  Answer:  Degenerative spondylolisthesis   07/01/16 1646   07/01/16 1647  DME 3 n 1  Once     07/01/16 1646       Signed: Oaklee Esther A 07/02/2016, 9:13 AM

## 2016-07-02 NOTE — Progress Notes (Signed)
Pt and family given D/C instructions with Rx's, verbal understanding was provided. Pt's incision is clean and dry with no sign of infection. Pt's IV was removed prior to D/C. Pt D/C'd home via wheelchair @ 1155 per MD order. Pt is stable @ D/C and has no other needs at this time. Rema FendtAshley Carrina Schoenberger, RN

## 2016-07-02 NOTE — Progress Notes (Signed)
Occupational Therapy Evaluation Patient Details Name: Teresa Yu MRN: [id number] DOB: 02/17/1965 Today's Date: 07/02/2016    History of Present Illness Pt is a 51 y/o female who presents s/p L4-L5 PLIF on 07/01/16.   Clinical Impression   Completed all education regarding compensatory techniques for ADL adhering to back precautions. Pt issued AE to assist with LB ADL.Marland Kitchen. Pt verbalized understanding of precautions. Pt safe to DC home with S when medically stable.     Follow Up Recommendations  Supervision - Intermittent    Equipment Recommendations  3 in 1 bedside commode    Recommendations for Other Services       Precautions / Restrictions Precautions Precautions: Fall;Back Precaution Booklet Issued: Yes (comment) Precaution Comments: Reviewed handout  Required Braces or Orthoses: Spinal Brace Spinal Brace: Lumbar corset;Applied in sitting position Restrictions Weight Bearing Restrictions: No      Mobility Bed Mobility Overal bed mobility: Needs Assistance Bed Mobility: Rolling;Sidelying to Sit Rolling: Supervision Sidelying to sit: Supervision     Sit to sidelying: Min assist General bed mobility comments: Assist for LE elevation up into bed.   Transfers Overall transfer level: Needs assistance Equipment used: None Transfers: Sit to/from Stand Sit to Stand: Supervision         General transfer comment: Assist to power-up to full standing position. VC's for hand placement on seated surface for safety.     Balance Overall balance assessment: No apparent balance deficits (not formally assessed) Sitting-balance support: Bilateral upper extremity supported;Feet supported Sitting balance-Leahy Scale: Poor Sitting balance - Comments: unable to sit without UE support due to pain Postural control: Posterior lean Standing balance support: No upper extremity supported;During functional activity Standing balance-Leahy Scale: Poor                              ADL either performed or assessed with clinical judgement   ADL Overall ADL's : Needs assistance/impaired                                     Functional mobility during ADLs: Supervision/safety General ADL Comments: Pt requies min A for LB ADL. Completed educaitoin regarding compensatory techqnieus with use of AE. Pt issued AE to increase independence with ADL adhering to back precautions. Pt verblaied understnaidng. Family presnet for educaiton. recommend pt use 3 in1 as shower seat.      Vision         Perception     Praxis      Pertinent Vitals/Pain Pain Assessment: 0-10 Pain Score: 2  Pain Location: Back Pain Descriptors / Indicators: Aching Pain Intervention(s): Limited activity within patient's tolerance     Hand Dominance     Extremity/Trunk Assessment Upper Extremity Assessment Upper Extremity Assessment: Defer to OT evaluation   Lower Extremity Assessment Lower Extremity Assessment: Defer to PT evaluation   Cervical / Trunk Assessment Cervical / Trunk Assessment: Other exceptions Cervical / Trunk Exceptions: s/p surgery   Communication Communication Communication: No difficulties   Cognition Arousal/Alertness: Awake/alert Behavior During Therapy: WFL for tasks assessed/performed Overall Cognitive Status: Within Functional Limits for tasks assessed                                     General Comments       Exercises  Shoulder Instructions      Home Living Family/patient expects to be discharged to:: Private residence Living Arrangements: Spouse/significant other Available Help at Discharge: Family Type of Home: House Home Access: Stairs to enter Secretary/administrator of Steps: 5 Entrance Stairs-Rails: Right Home Layout: One level     Bathroom Shower/Tub: Chief Strategy Officer: Standard Bathroom Accessibility: Yes How Accessible: Accessible via walker Home Equipment: None           Prior Functioning/Environment Level of Independence: Independent        Comments: ADL were difficult to complete        OT Problem List: Decreased strength;Decreased activity tolerance;Decreased knowledge of use of DME or AE;Decreased knowledge of precautions;Obesity;Pain      OT Treatment/Interventions:      OT Goals(Current goals can be found in the care plan section) Acute Rehab OT Goals Patient Stated Goal: Independence and pain control OT Goal Formulation: All assessment and education complete, DC therapy  OT Frequency:     Barriers to D/C:            Co-evaluation              AM-PAC PT "6 Clicks" Daily Activity     Outcome Measure Help from another person eating meals?: None Help from another person taking care of personal grooming?: None Help from another person toileting, which includes using toliet, bedpan, or urinal?: A Little Help from another person bathing (including washing, rinsing, drying)?: A Little Help from another person to put on and taking off regular upper body clothing?: A Little Help from another person to put on and taking off regular lower body clothing?: A Little 6 Click Score: 20   End of Session Equipment Utilized During Treatment: Back brace Nurse Communication: Other (comment) (DC needs)  Activity Tolerance: Patient tolerated treatment well Patient left: in bed;with call bell/phone within reach;with family/visitor present  OT Visit Diagnosis: Unsteadiness on feet (R26.81);Muscle weakness (generalized) (M62.81);Pain Pain - part of body:  (back)                Time: 4098-1191 OT Time Calculation (min): 20 min Charges:  OT General Charges $OT Visit: 1 Procedure OT Evaluation $OT Eval Moderate Complexity: 1 Procedure G-Codes:     Paisley Grajeda, OT/L  [phone] 07/02/2016  Kamsiyochukwu Spickler,Teresa Yu 07/02/2016, 1:13 PM

## 2016-07-02 NOTE — Evaluation (Signed)
Physical Therapy Evaluation Patient Details Name: Teresa Yu MRN: [id number] DOB: 09/10/1965 Today's Date: 07/02/2016   History of Present Illness  Pt is a 51 y/o female who presents s/p L4-L5 PLIF on 07/01/16.  Clinical Impression  Pt admitted with above diagnosis. Pt currently with functional limitations due to the deficits listed below (see PT Problem List). At the time of PT eval pt was able to perform transfers and ambulation with min guard to min assist. Pt limited by pain this session and became emotional at one point regarding pain and need for assist at this time. Pt will benefit from skilled PT to increase their independence and safety with mobility to allow discharge to the venue listed below.       Follow Up Recommendations No PT follow up;Supervision for mobility/OOB    Equipment Recommendations  Rolling walker with 5" wheels;3in1 (PT)    Recommendations for Other Services       Precautions / Restrictions Precautions Precautions: Fall;Back Precaution Booklet Issued: Yes (comment) Precaution Comments: Reviewed handout and pt was cued for precautions during functional mobility.  Required Braces or Orthoses: Spinal Brace Spinal Brace: Lumbar corset;Applied in sitting position Restrictions Weight Bearing Restrictions: No      Mobility  Bed Mobility Overal bed mobility: Needs Assistance Bed Mobility: Rolling;Sit to Sidelying Rolling: Supervision       Sit to sidelying: Min assist General bed mobility comments: Assist for LE elevation up into bed.   Transfers Overall transfer level: Needs assistance Equipment used: None Transfers: Sit to/from Stand Sit to Stand: Min assist         General transfer comment: Assist to power-up to full standing position. VC's for hand placement on seated surface for safety.   Ambulation/Gait Ambulation/Gait assistance: Min assist;Min guard Ambulation Distance (Feet): 100 Feet Assistive device: Rolling walker (2 wheeled);1  person hand held assist Gait Pattern/deviations: Step-through pattern;Decreased stride length;Trunk flexed Gait velocity: Decreased Gait velocity interpretation: Below normal speed for age/gender General Gait Details: Without walker, pt unsteady and required assist from therapist for balance support. With walker pt was at a min guard level.   Stairs Stairs: Yes Stairs assistance: Min assist Stair Management: One rail Right;Step to pattern;Forwards Number of Stairs: 5 General stair comments: Assist for balance and power up to next step. VC's for sequencing and safety.  Wheelchair Mobility    Modified Rankin (Stroke Patients Only)       Balance Overall balance assessment: Needs assistance Sitting-balance support: Bilateral upper extremity supported;Feet supported Sitting balance-Leahy Scale: Poor Sitting balance - Comments: unable to sit without UE support due to pain Postural control: Posterior lean Standing balance support: No upper extremity supported;During functional activity Standing balance-Leahy Scale: Poor                               Pertinent Vitals/Pain Pain Assessment: 0-10 Pain Score: 8  Pain Location: Back Pain Descriptors / Indicators: Burning;Operative site guarding Pain Intervention(s): Limited activity within patient's tolerance;Monitored during session;Repositioned    Home Living Family/patient expects to be discharged to:: Private residence Living Arrangements: Spouse/significant other Available Help at Discharge: Family Type of Home: House Home Access: Stairs to enter Entrance Stairs-Rails: Right Entrance Stairs-Number of Steps: 5 Home Layout: One level Home Equipment: None      Prior Function Level of Independence: Independent               Hand Dominance  Extremity/Trunk Assessment   Upper Extremity Assessment Upper Extremity Assessment: Defer to OT evaluation    Lower Extremity Assessment Lower Extremity  Assessment: Generalized weakness    Cervical / Trunk Assessment Cervical / Trunk Assessment: Other exceptions Cervical / Trunk Exceptions: s/p surgery  Communication   Communication: No difficulties  Cognition Arousal/Alertness: Awake/alert Behavior During Therapy: WFL for tasks assessed/performed Overall Cognitive Status: Within Functional Limits for tasks assessed                                        General Comments      Exercises     Assessment/Plan    PT Assessment Patient needs continued PT services  PT Problem List Decreased strength;Decreased range of motion;Decreased activity tolerance;Decreased balance;Decreased mobility;Decreased knowledge of use of DME;Decreased safety awareness;Decreased knowledge of precautions;Pain       PT Treatment Interventions DME instruction;Gait training;Stair training;Functional mobility training;Therapeutic activities;Therapeutic exercise;Neuromuscular re-education;Patient/family education    PT Goals (Current goals can be found in the Care Plan section)  Acute Rehab PT Goals Patient Stated Goal: Independence and pain control PT Goal Formulation: With patient Time For Goal Achievement: 07/09/16 Potential to Achieve Goals: Good    Frequency Min 5X/week   Barriers to discharge        Co-evaluation               AM-PAC PT "6 Clicks" Daily Activity  Outcome Measure Difficulty turning over in bed (including adjusting bedclothes, sheets and blankets)?: Total Difficulty moving from lying on back to sitting on the side of the bed? : Total Difficulty sitting down on and standing up from a chair with arms (e.g., wheelchair, bedside commode, etc,.)?: Total Help needed moving to and from a bed to chair (including a wheelchair)?: A Little Help needed walking in hospital room?: A Little Help needed climbing 3-5 steps with a railing? : A Little 6 Click Score: 12    End of Session Equipment Utilized During  Treatment: Gait belt;Back brace Activity Tolerance: No increased pain;Patient limited by fatigue Patient left: in bed;with call bell/phone within reach Nurse Communication: Mobility status PT Visit Diagnosis: Unsteadiness on feet (R26.81);Pain Pain - part of body:  (back)    Time: 1610-9604 PT Time Calculation (min) (ACUTE ONLY): 22 min   Charges:   PT Evaluation $PT Eval Moderate Complexity: 1 Procedure     PT G Codes:        Conni Slipper, PT, DPT Acute Rehabilitation Services Pager: [phone]   Marylynn Pearson 07/02/2016, 12:45 PM

## 2016-10-03 NOTE — Addendum Note (Signed)
Addendum  created 10/03/16 1452 by Kipp Brood, MD   Sign clinical note

## 2016-10-31 ENCOUNTER — Other Ambulatory Visit: Payer: Self-pay | Admitting: Neurosurgery

## 2016-11-05 ENCOUNTER — Encounter (HOSPITAL_COMMUNITY): Payer: Self-pay

## 2016-11-05 ENCOUNTER — Encounter (HOSPITAL_COMMUNITY)
Admission: RE | Admit: 2016-11-05 | Discharge: 2016-11-05 | Disposition: A | Discharge status: Home / Self Care | Payer: Medicaid Other | Source: Ambulatory Visit | Attending: Neurosurgery | Admitting: Neurosurgery

## 2016-11-05 DIAGNOSIS — Z01818 Encounter for other preprocedural examination: Secondary | ICD-10-CM | POA: Insufficient documentation

## 2016-11-05 HISTORY — DX: Unspecified convulsions: R56.9

## 2016-11-05 HISTORY — DX: Other abnormality of red blood cells: R71.8

## 2016-11-05 LAB — CBC
HEMATOCRIT: 42.6 % (ref 36.0–46.0)
Hemoglobin: 13.9 g/dL (ref 12.0–15.0)
MCH: 28 pg (ref 26.0–34.0)
MCHC: 32.6 g/dL (ref 30.0–36.0)
MCV: 85.7 fL (ref 78.0–100.0)
Platelets: 374 10*3/uL (ref 150–400)
RBC: 4.97 MIL/uL (ref 3.87–5.11)
RDW: 16.1 % — AB (ref 11.5–15.5)
WBC: 13.7 10*3/uL — AB (ref 4.0–10.5)

## 2016-11-05 LAB — HCG, SERUM, QUALITATIVE: PREG SERUM: NEGATIVE

## 2016-11-05 LAB — TYPE AND SCREEN
ABO/RH(D): A POS
ANTIBODY SCREEN: NEGATIVE

## 2016-11-05 LAB — BASIC METABOLIC PANEL
ANION GAP: 9 (ref 5–15)
BUN: 13 mg/dL (ref 6–20)
CHLORIDE: 104 mmol/L (ref 101–111)
CO2: 22 mmol/L (ref 22–32)
Calcium: 9.5 mg/dL (ref 8.9–10.3)
Creatinine, Ser: 0.69 mg/dL (ref 0.44–1.00)
GFR calc Af Amer: >60 mL/min (ref >60)
GLUCOSE: 92 mg/dL (ref 65–99)
POTASSIUM: 4.6 mmol/L (ref 3.5–5.1)
Sodium: 135 mmol/L (ref 135–145)

## 2016-11-05 LAB — SURGICAL PCR SCREEN
MRSA, PCR: NEGATIVE
Staphylococcus aureus: NEGATIVE

## 2016-11-05 NOTE — Progress Notes (Signed)
REQUESTED ECHO, OV FROM DR. RENALDO  [phone].

## 2016-11-05 NOTE — Pre-Procedure Instructions (Signed)
Teresa Yu  11/05/2016      [facility] [address] [place] - [address] [place] [address] Phone: [phone] Fax: [phone]    Your procedure is scheduled on   Monday  11/11/16  Report to [facility] Admitting at 1000 A.M.  Call this number if you have problems the morning of surgery:  [phone]   Remember:  Do not eat food or drink liquids after midnight.  Take these medicines the morning of surgery with A SIP OF WATER  EYE DROPS, CITALOPRAM (CELEXA), OXYCODONE IF NEEDED  7 days prior to surgery STOP taking any Aspirin, Aleve, Naproxen, Ibuprofen, Motrin, Advil, Goody's, BC's, all herbal medications, fish oil, and all vitamins   Do not wear jewelry, make-up or nail polish.  Do not wear lotions, powders, or perfumes, or deoderant.  Do not shave 48 hours prior to surgery.  Men may shave face and neck.  Do not bring valuables to the hospital.  [facility] is not responsible for any belongings or valuables.  Contacts, dentures or bridgework may not be worn into surgery.  Leave your suitcase in the car.  After surgery it may be brought to your room.  For patients admitted to the hospital, discharge time will be determined by your treatment team.  Patients discharged the day of surgery will not be allowed to drive home.   Name and phone number of your driver:    Special instructions:  [place] - Preparing for Surgery  Before surgery, you can play an important role.  Because skin is not sterile, your skin needs to be as free of germs as possible.  You can reduce the number of germs on you skin by washing with CHG (chlorahexidine gluconate) soap before surgery.  CHG is an antiseptic cleaner which kills germs and bonds with the skin to continue killing germs even after washing.  Please DO NOT use if you have an allergy to CHG or antibacterial soaps.  If your skin becomes reddened/irritated stop using the CHG and inform your  nurse when you arrive at Short Stay.  Do not shave (including legs and underarms) for at least 48 hours prior to the first CHG shower.  You may shave your face.  Please follow these instructions carefully:   1.  Shower with CHG Soap the night before surgery and the                                morning of Surgery.  2.  If you choose to wash your hair, wash your hair first as usual with your       normal shampoo.  3.  After you shampoo, rinse your hair and body thoroughly to remove the                      Shampoo.  4.  Use CHG as you would any other liquid soap.  You can apply chg directly       to the skin and wash gently with scrungie or a clean washcloth.  5.  Apply the CHG Soap to your body ONLY FROM THE NECK DOWN.        Do not use on open wounds or open sores.  Avoid contact with your eyes,       ears, mouth and genitals (private parts).  Wash genitals (private parts)  with your normal soap.  6.  Wash thoroughly, paying special attention to the area where your surgery        will be performed.  7.  Thoroughly rinse your body with warm water from the neck down.  8.  DO NOT shower/wash with your normal soap after using and rinsing off       the CHG Soap.  9.  Pat yourself dry with a clean towel.            10.  Wear clean pajamas.            11.  Place clean sheets on your bed the night of your first shower and do not        sleep with pets.  Day of Surgery  Do not apply any lotions/deoderants the morning of surgery.  Please wear clean clothes to the hospital/surgery center.    Please read over the following fact sheets that you were given. Pain Booklet, MRSA Information and Surgical Site Infection Prevention

## 2016-11-10 NOTE — Anesthesia Preprocedure Evaluation (Addendum)
Anesthesia Evaluation  Patient identified by MRN, date of birth, ID band Patient awake    Reviewed: Allergy & Precautions, NPO status , Patient's Chart, lab work & pertinent test results  History of Anesthesia Complications (+) PONV and history of anesthetic complications  Airway Mallampati: II  TM Distance: >3 FB Neck ROM: Full    Dental  (+) Edentulous Upper, Dental Advisory Given   Pulmonary neg pulmonary ROS, Current Smoker,    breath sounds clear to auscultation       Cardiovascular negative cardio ROS   Rhythm:Regular Rate:Normal     Neuro/Psych  Headaches, Seizures -,  PSYCHIATRIC DISORDERS Anxiety Depression  Neuromuscular disease    GI/Hepatic negative GI ROS, Neg liver ROS,   Endo/Other  negative endocrine ROS  Renal/GU negative Renal ROS     Musculoskeletal  (+) Arthritis ,   Abdominal (+) + obese,   Peds  Hematology negative hematology ROS (+)   Anesthesia Other Findings Day of surgery medications reviewed with the patient.  Reproductive/Obstetrics                           Anesthesia Physical Anesthesia Plan  ASA: III  Anesthesia Plan: General   Post-op Pain Management:    Induction: Intravenous  PONV Risk Score and Plan: 3 and Ondansetron, Dexamethasone, Midazolam and Treatment may vary due to age or medical condition  Airway Management Planned: Oral ETT and Video Laryngoscope Planned  Additional Equipment:   Intra-op Plan:   Post-operative Plan: Extubation in OR  Informed Consent:   Plan Discussed with: CRNA  Anesthesia Plan Comments:         Anesthesia Quick Evaluation

## 2016-11-11 ENCOUNTER — Encounter (HOSPITAL_COMMUNITY)
Admission: RE | Disposition: A | Discharge status: Home / Self Care | Payer: Self-pay | Source: Ambulatory Visit | Attending: Neurosurgery

## 2016-11-11 ENCOUNTER — Ambulatory Visit (HOSPITAL_COMMUNITY): Payer: Medicaid Other

## 2016-11-11 ENCOUNTER — Ambulatory Visit (HOSPITAL_COMMUNITY): Payer: Medicaid Other | Admitting: Anesthesiology

## 2016-11-11 ENCOUNTER — Encounter (HOSPITAL_COMMUNITY): Payer: Self-pay | Admitting: *Deleted

## 2016-11-11 ENCOUNTER — Inpatient Hospital Stay (HOSPITAL_COMMUNITY)
Admission: RE | Admit: 2016-11-11 | Discharge: 2016-11-12 | DRG: 472 | Disposition: A | Discharge status: Home / Self Care | Payer: Medicaid Other | Source: Ambulatory Visit | Attending: Neurosurgery | Admitting: Neurosurgery

## 2016-11-11 DIAGNOSIS — M40209 Unspecified kyphosis, site unspecified: Secondary | ICD-10-CM | POA: Diagnosis present

## 2016-11-11 DIAGNOSIS — Z23 Encounter for immunization: Secondary | ICD-10-CM | POA: Diagnosis not present

## 2016-11-11 DIAGNOSIS — Z79899 Other long term (current) drug therapy: Secondary | ICD-10-CM | POA: Diagnosis not present

## 2016-11-11 DIAGNOSIS — M1990 Unspecified osteoarthritis, unspecified site: Secondary | ICD-10-CM | POA: Diagnosis present

## 2016-11-11 DIAGNOSIS — M4802 Spinal stenosis, cervical region: Secondary | ICD-10-CM | POA: Diagnosis present

## 2016-11-11 DIAGNOSIS — M4712 Other spondylosis with myelopathy, cervical region: Secondary | ICD-10-CM | POA: Diagnosis present

## 2016-11-11 DIAGNOSIS — Z9889 Other specified postprocedural states: Secondary | ICD-10-CM

## 2016-11-11 DIAGNOSIS — Z419 Encounter for procedure for purposes other than remedying health state, unspecified: Secondary | ICD-10-CM

## 2016-11-11 DIAGNOSIS — Z886 Allergy status to analgesic agent status: Secondary | ICD-10-CM

## 2016-11-11 DIAGNOSIS — F419 Anxiety disorder, unspecified: Secondary | ICD-10-CM | POA: Diagnosis present

## 2016-11-11 DIAGNOSIS — Z9851 Tubal ligation status: Secondary | ICD-10-CM | POA: Diagnosis not present

## 2016-11-11 DIAGNOSIS — H548 Legal blindness, as defined in USA: Secondary | ICD-10-CM | POA: Diagnosis present

## 2016-11-11 DIAGNOSIS — F329 Major depressive disorder, single episode, unspecified: Secondary | ICD-10-CM | POA: Diagnosis present

## 2016-11-11 DIAGNOSIS — Z9841 Cataract extraction status, right eye: Secondary | ICD-10-CM

## 2016-11-11 DIAGNOSIS — Z961 Presence of intraocular lens: Secondary | ICD-10-CM | POA: Diagnosis present

## 2016-11-11 DIAGNOSIS — F17210 Nicotine dependence, cigarettes, uncomplicated: Secondary | ICD-10-CM | POA: Diagnosis present

## 2016-11-11 DIAGNOSIS — Z809 Family history of malignant neoplasm, unspecified: Secondary | ICD-10-CM | POA: Diagnosis not present

## 2016-11-11 HISTORY — PX: ANTERIOR CERVICAL DECOMP/DISCECTOMY FUSION: SHX1161

## 2016-11-11 SURGERY — ANTERIOR CERVICAL DECOMPRESSION/DISCECTOMY FUSION 2 LEVELS
Anesthesia: General

## 2016-11-11 MED ORDER — MIDAZOLAM HCL 2 MG/2ML IJ SOLN
INTRAMUSCULAR | Status: AC
  Filled 2016-11-11: qty 2

## 2016-11-11 MED ORDER — CHLORHEXIDINE GLUCONATE CLOTH 2 % EX PADS: 6.0000 | MEDICATED_PAD | Freq: Once | CUTANEOUS | Status: DC

## 2016-11-11 MED ORDER — OXYCODONE HCL 5 MG PO TABS
10.0000 mg | ORAL_TABLET | Freq: Four times a day (QID) | ORAL | Status: DC
  Administered 2016-11-11 (×2): 10 mg via ORAL
  Filled 2016-11-11 (×2): qty 2

## 2016-11-11 MED ORDER — SUGAMMADEX SODIUM 200 MG/2ML IV SOLN
INTRAVENOUS | Status: DC | PRN
  Administered 2016-11-11: 200 mg via INTRAVENOUS

## 2016-11-11 MED ORDER — ONDANSETRON HCL 4 MG/2ML IJ SOLN
INTRAMUSCULAR | Status: AC
  Filled 2016-11-11: qty 2

## 2016-11-11 MED ORDER — MEPERIDINE HCL 25 MG/ML IJ SOLN: 6.2500 mg | INTRAMUSCULAR | Status: DC | PRN

## 2016-11-11 MED ORDER — DEXAMETHASONE SODIUM PHOSPHATE 10 MG/ML IJ SOLN
INTRAMUSCULAR | Status: DC | PRN
  Administered 2016-11-11: 10 mg via INTRAVENOUS

## 2016-11-11 MED ORDER — PHENYLEPHRINE 40 MCG/ML (10ML) SYRINGE FOR IV PUSH (FOR BLOOD PRESSURE SUPPORT)
PREFILLED_SYRINGE | INTRAVENOUS | Status: AC
  Filled 2016-11-11: qty 10

## 2016-11-11 MED ORDER — OXYCODONE HCL 5 MG PO TABS
10.0000 mg | ORAL_TABLET | ORAL | Status: DC | PRN
  Administered 2016-11-11 – 2016-11-12 (×5): 10 mg via ORAL
  Filled 2016-11-11 (×5): qty 2

## 2016-11-11 MED ORDER — LIDOCAINE 2% (20 MG/ML) 5 ML SYRINGE
INTRAMUSCULAR | Status: AC
  Filled 2016-11-11: qty 5

## 2016-11-11 MED ORDER — DEXAMETHASONE SODIUM PHOSPHATE 10 MG/ML IJ SOLN
INTRAMUSCULAR | Status: AC
  Filled 2016-11-11: qty 1

## 2016-11-11 MED ORDER — ONDANSETRON HCL 4 MG/2ML IJ SOLN
INTRAMUSCULAR | Status: DC | PRN
  Administered 2016-11-11 (×2): 4 mg via INTRAVENOUS

## 2016-11-11 MED ORDER — CYCLOBENZAPRINE HCL 10 MG PO TABS
10.0000 mg | ORAL_TABLET | Freq: Three times a day (TID) | ORAL | Status: DC | PRN
  Administered 2016-11-11 – 2016-11-12 (×3): 10 mg via ORAL
  Filled 2016-11-11 (×3): qty 1

## 2016-11-11 MED ORDER — PROPOFOL 10 MG/ML IV BOLUS
INTRAVENOUS | Status: DC | PRN
  Administered 2016-11-11: 120 mg via INTRAVENOUS

## 2016-11-11 MED ORDER — ALBUTEROL SULFATE HFA 108 (90 BASE) MCG/ACT IN AERS
INHALATION_SPRAY | RESPIRATORY_TRACT | Status: DC | PRN
  Administered 2016-11-11: 6 via RESPIRATORY_TRACT

## 2016-11-11 MED ORDER — HYDROMORPHONE HCL 1 MG/ML IJ SOLN
INTRAMUSCULAR | Status: AC
  Filled 2016-11-11: qty 1

## 2016-11-11 MED ORDER — PHENOL 1.4 % MT LIQD
1.0000 | OROMUCOSAL | Status: DC | PRN
  Filled 2016-11-11: qty 177

## 2016-11-11 MED ORDER — THROMBIN 5000 UNITS EX SOLR
CUTANEOUS | Status: DC | PRN
  Administered 2016-11-11 (×2): 5000 [IU] via TOPICAL

## 2016-11-11 MED ORDER — ACETAMINOPHEN 500 MG PO TABS
1000.0000 mg | ORAL_TABLET | Freq: Four times a day (QID) | ORAL | Status: DC | PRN
  Administered 2016-11-11 – 2016-11-12 (×3): 1000 mg via ORAL
  Filled 2016-11-11 (×4): qty 2

## 2016-11-11 MED ORDER — THROMBIN 5000 UNITS EX SOLR
CUTANEOUS | Status: AC
  Filled 2016-11-11: qty 10000

## 2016-11-11 MED ORDER — ROCURONIUM BROMIDE 10 MG/ML (PF) SYRINGE
PREFILLED_SYRINGE | INTRAVENOUS | Status: DC | PRN
  Administered 2016-11-11: 50 mg via INTRAVENOUS

## 2016-11-11 MED ORDER — PNEUMOCOCCAL VAC POLYVALENT 25 MCG/0.5ML IJ INJ
0.5000 mL | INJECTION | INTRAMUSCULAR | Status: AC
  Administered 2016-11-12: 0.5 mL via INTRAMUSCULAR
  Filled 2016-11-11: qty 0.5

## 2016-11-11 MED ORDER — ROCURONIUM BROMIDE 10 MG/ML (PF) SYRINGE
PREFILLED_SYRINGE | INTRAVENOUS | Status: AC
  Filled 2016-11-11: qty 5

## 2016-11-11 MED ORDER — HYDROCODONE-ACETAMINOPHEN 5-325 MG PO TABS
ORAL_TABLET | ORAL | Status: AC
  Filled 2016-11-11: qty 1

## 2016-11-11 MED ORDER — HYDROMORPHONE HCL 1 MG/ML IJ SOLN
0.2500 mg | INTRAMUSCULAR | Status: DC | PRN
Start: 2016-11-11 — End: 2016-11-11
  Administered 2016-11-11 (×4): 0.5 mg via INTRAVENOUS

## 2016-11-11 MED ORDER — SODIUM CHLORIDE 0.9% FLUSH: 3.0000 mL | INTRAVENOUS | Status: DC | PRN

## 2016-11-11 MED ORDER — ESTROVEN MENOPAUSE RELIEF PO CAPS: 1.0000 | ORAL_CAPSULE | Freq: Every day | ORAL | Status: DC

## 2016-11-11 MED ORDER — LACTATED RINGERS IV SOLN: INTRAVENOUS | Status: DC

## 2016-11-11 MED ORDER — CEFAZOLIN SODIUM-DEXTROSE 1-4 GM/50ML-% IV SOLN
1.0000 g | Freq: Three times a day (TID) | INTRAVENOUS | Status: AC
  Administered 2016-11-11 (×2): 1 g via INTRAVENOUS
  Filled 2016-11-11 (×2): qty 50

## 2016-11-11 MED ORDER — PROPOFOL 10 MG/ML IV BOLUS
INTRAVENOUS | Status: AC
  Filled 2016-11-11: qty 20

## 2016-11-11 MED ORDER — SUCCINYLCHOLINE CHLORIDE 200 MG/10ML IV SOSY
PREFILLED_SYRINGE | INTRAVENOUS | Status: DC | PRN
  Administered 2016-11-11: 120 mg via INTRAVENOUS

## 2016-11-11 MED ORDER — ONDANSETRON HCL 4 MG/2ML IJ SOLN: 4.0000 mg | Freq: Four times a day (QID) | INTRAMUSCULAR | Status: DC | PRN

## 2016-11-11 MED ORDER — CEFAZOLIN SODIUM-DEXTROSE 2-4 GM/100ML-% IV SOLN
2.0000 g | INTRAVENOUS | Status: AC
  Administered 2016-11-11: 2 g via INTRAVENOUS

## 2016-11-11 MED ORDER — LIDOCAINE 2% (20 MG/ML) 5 ML SYRINGE
INTRAMUSCULAR | Status: DC | PRN
  Administered 2016-11-11: 40 mg via INTRAVENOUS
  Administered 2016-11-11: 50 mg via INTRAVENOUS

## 2016-11-11 MED ORDER — MIDAZOLAM HCL 2 MG/2ML IJ SOLN
INTRAMUSCULAR | Status: DC | PRN
  Administered 2016-11-11: 2 mg via INTRAVENOUS

## 2016-11-11 MED ORDER — FENTANYL CITRATE (PF) 250 MCG/5ML IJ SOLN
INTRAMUSCULAR | Status: AC
  Filled 2016-11-11: qty 5

## 2016-11-11 MED ORDER — FENTANYL CITRATE (PF) 250 MCG/5ML IJ SOLN
INTRAMUSCULAR | Status: DC | PRN
  Administered 2016-11-11 (×5): 50 ug via INTRAVENOUS

## 2016-11-11 MED ORDER — PROPOFOL 1000 MG/100ML IV EMUL
INTRAVENOUS | Status: AC
  Filled 2016-11-11: qty 100

## 2016-11-11 MED ORDER — PROMETHAZINE HCL 25 MG/ML IJ SOLN: 6.2500 mg | INTRAMUSCULAR | Status: DC | PRN

## 2016-11-11 MED ORDER — ZOLPIDEM TARTRATE 5 MG PO TABS
5.0000 mg | ORAL_TABLET | Freq: Every evening | ORAL | Status: DC | PRN
  Administered 2016-11-11: 5 mg via ORAL
  Filled 2016-11-11: qty 1

## 2016-11-11 MED ORDER — INFLUENZA VAC SPLIT QUAD 0.5 ML IM SUSY
0.5000 mL | PREFILLED_SYRINGE | INTRAMUSCULAR | Status: AC
  Administered 2016-11-12: 0.5 mL via INTRAMUSCULAR
  Filled 2016-11-11: qty 0.5

## 2016-11-11 MED ORDER — SODIUM CHLORIDE 0.9 % IR SOLN
Status: DC | PRN
  Administered 2016-11-11: 09:00:00

## 2016-11-11 MED ORDER — ONDANSETRON HCL 4 MG PO TABS: 4.0000 mg | ORAL_TABLET | Freq: Four times a day (QID) | ORAL | Status: DC | PRN

## 2016-11-11 MED ORDER — MENTHOL 3 MG MT LOZG
1.0000 | LOZENGE | OROMUCOSAL | Status: DC | PRN
  Administered 2016-11-12: 3 mg via ORAL
  Filled 2016-11-11 (×2): qty 9

## 2016-11-11 MED ORDER — HYDROCODONE-ACETAMINOPHEN 5-325 MG PO TABS
1.0000 | ORAL_TABLET | ORAL | Status: DC | PRN
  Administered 2016-11-11: 1 via ORAL

## 2016-11-11 MED ORDER — 0.9 % SODIUM CHLORIDE (POUR BTL) OPTIME
TOPICAL | Status: DC | PRN
  Administered 2016-11-11: 1000 mL

## 2016-11-11 MED ORDER — SODIUM CHLORIDE 0.9% FLUSH
3.0000 mL | Freq: Two times a day (BID) | INTRAVENOUS | Status: DC
  Administered 2016-11-11 (×2): 3 mL via INTRAVENOUS

## 2016-11-11 MED ORDER — PHENYLEPHRINE HCL 10 MG/ML IJ SOLN
INTRAMUSCULAR | Status: DC | PRN
  Administered 2016-11-11: 20 ug/min via INTRAVENOUS

## 2016-11-11 MED ORDER — HYDROMORPHONE HCL 1 MG/ML IJ SOLN: 0.5000 mg | INTRAMUSCULAR | Status: DC | PRN

## 2016-11-11 MED ORDER — LACTATED RINGERS IV SOLN
INTRAVENOUS | Status: DC
  Administered 2016-11-11 (×2): via INTRAVENOUS

## 2016-11-11 MED ORDER — ALBUTEROL SULFATE HFA 108 (90 BASE) MCG/ACT IN AERS
INHALATION_SPRAY | RESPIRATORY_TRACT | Status: AC
  Filled 2016-11-11: qty 6.7

## 2016-11-11 MED ORDER — PHENYLEPHRINE 40 MCG/ML (10ML) SYRINGE FOR IV PUSH (FOR BLOOD PRESSURE SUPPORT)
PREFILLED_SYRINGE | INTRAVENOUS | Status: DC | PRN
  Administered 2016-11-11 (×2): 80 ug via INTRAVENOUS

## 2016-11-11 MED ORDER — SUGAMMADEX SODIUM 200 MG/2ML IV SOLN
INTRAVENOUS | Status: AC
  Filled 2016-11-11: qty 2

## 2016-11-11 MED ORDER — GLYCOPYRROLATE 0.2 MG/ML IV SOSY
PREFILLED_SYRINGE | INTRAVENOUS | Status: DC | PRN
  Administered 2016-11-11: .2 mg via INTRAVENOUS

## 2016-11-11 MED ORDER — CEFAZOLIN SODIUM-DEXTROSE 2-4 GM/100ML-% IV SOLN
INTRAVENOUS | Status: AC
  Filled 2016-11-11: qty 100

## 2016-11-11 MED ORDER — PROPOFOL 500 MG/50ML IV EMUL
INTRAVENOUS | Status: DC | PRN
  Administered 2016-11-11: 25 ug/kg/min via INTRAVENOUS

## 2016-11-11 MED ORDER — SUCCINYLCHOLINE CHLORIDE 200 MG/10ML IV SOSY
PREFILLED_SYRINGE | INTRAVENOUS | Status: AC
  Filled 2016-11-11: qty 20

## 2016-11-11 MED ORDER — CITALOPRAM HYDROBROMIDE 20 MG PO TABS
20.0000 mg | ORAL_TABLET | Freq: Every day | ORAL | Status: DC
  Administered 2016-11-11 – 2016-11-12 (×2): 20 mg via ORAL
  Filled 2016-11-11 (×2): qty 1

## 2016-11-11 SURGICAL SUPPLY — 57 items
BAG DECANTER FOR FLEXI CONT (MISCELLANEOUS) ×3 IMPLANT
BENZOIN TINCTURE PRP APPL 2/3 (GAUZE/BANDAGES/DRESSINGS) ×3 IMPLANT
BIT DRILL 13 (BIT) ×2 IMPLANT
BIT DRILL 13MM (BIT) ×1
BUR MATCHSTICK NEURO 3.0 LAGG (BURR) ×3 IMPLANT
CAGE PEEK 6X14X11 (Cage) ×4 IMPLANT
CAGE SPNL 11X14X6XRADOPQ (Cage) ×1 IMPLANT
CANISTER SUCT 3000ML PPV (MISCELLANEOUS) ×3 IMPLANT
CARTRIDGE OIL MAESTRO DRILL (MISCELLANEOUS) ×1 IMPLANT
CLOSURE WOUND 1/2 X4 (GAUZE/BANDAGES/DRESSINGS) ×1
DIFFUSER DRILL AIR PNEUMATIC (MISCELLANEOUS) ×3 IMPLANT
DRAPE C-ARM 42X72 X-RAY (DRAPES) ×6 IMPLANT
DRAPE LAPAROTOMY 100X72 PEDS (DRAPES) ×3 IMPLANT
DRAPE MICROSCOPE LEICA (MISCELLANEOUS) ×3 IMPLANT
DRAPE POUCH INSTRU U-SHP 10X18 (DRAPES) ×3 IMPLANT
DURAPREP 6ML APPLICATOR 50/CS (WOUND CARE) ×3 IMPLANT
ELECT COATED BLADE 2.86 ST (ELECTRODE) ×3 IMPLANT
ELECT REM PT RETURN 9FT ADLT (ELECTROSURGICAL) ×3
ELECTRODE REM PT RTRN 9FT ADLT (ELECTROSURGICAL) ×1 IMPLANT
GAUZE SPONGE 4X4 12PLY STRL (GAUZE/BANDAGES/DRESSINGS) ×3 IMPLANT
GAUZE SPONGE 4X4 12PLY STRL LF (GAUZE/BANDAGES/DRESSINGS) ×3 IMPLANT
GAUZE SPONGE 4X4 16PLY XRAY LF (GAUZE/BANDAGES/DRESSINGS) IMPLANT
GLOVE ECLIPSE 9.0 STRL (GLOVE) ×3 IMPLANT
GLOVE EXAM NITRILE LRG STRL (GLOVE) IMPLANT
GLOVE EXAM NITRILE XL STR (GLOVE) IMPLANT
GLOVE EXAM NITRILE XS STR PU (GLOVE) IMPLANT
GOWN STRL REUS W/ TWL LRG LVL3 (GOWN DISPOSABLE) IMPLANT
GOWN STRL REUS W/ TWL XL LVL3 (GOWN DISPOSABLE) ×2 IMPLANT
GOWN STRL REUS W/TWL 2XL LVL3 (GOWN DISPOSABLE) IMPLANT
GOWN STRL REUS W/TWL LRG LVL3 (GOWN DISPOSABLE)
GOWN STRL REUS W/TWL XL LVL3 (GOWN DISPOSABLE) ×4
HALTER HD/CHIN CERV TRACTION D (MISCELLANEOUS) ×3 IMPLANT
HEMOSTAT SURGICEL 2X14 (HEMOSTASIS) IMPLANT
KIT BASIN OR (CUSTOM PROCEDURE TRAY) ×3 IMPLANT
KIT ROOM TURNOVER OR (KITS) ×3 IMPLANT
NEEDLE SPNL 20GX3.5 QUINCKE YW (NEEDLE) ×3 IMPLANT
NS IRRIG 1000ML POUR BTL (IV SOLUTION) ×3 IMPLANT
OIL CARTRIDGE MAESTRO DRILL (MISCELLANEOUS) ×3
PACK LAMINECTOMY NEURO (CUSTOM PROCEDURE TRAY) ×3 IMPLANT
PAD ARMBOARD 7.5X6 YLW CONV (MISCELLANEOUS) ×9 IMPLANT
PLATE VISION ELITE 40MM (Plate) ×3 IMPLANT
RUBBERBAND STERILE (MISCELLANEOUS) ×6 IMPLANT
SCREW ST 13X4XST VA NS SPNE (Screw) ×6 IMPLANT
SCREW ST VAR 4 ATL (Screw) ×12 IMPLANT
SPACER SPNL 11X14X6XPEEK CVD (Cage) ×1 IMPLANT
SPCR SPNL 11X14X6XPEEK CVD (Cage) ×1 IMPLANT
SPONGE INTESTINAL PEANUT (DISPOSABLE) ×3 IMPLANT
SPONGE SURGIFOAM ABS GEL SZ50 (HEMOSTASIS) ×3 IMPLANT
STRIP CLOSURE SKIN 1/2X4 (GAUZE/BANDAGES/DRESSINGS) ×2 IMPLANT
SUT VIC AB 3-0 SH 8-18 (SUTURE) ×3 IMPLANT
SUT VIC AB 4-0 RB1 18 (SUTURE) ×3 IMPLANT
TAPE CLOTH 4X10 WHT NS (GAUZE/BANDAGES/DRESSINGS) ×3 IMPLANT
TAPE CLOTH SURG 4X10 WHT LF (GAUZE/BANDAGES/DRESSINGS) ×3 IMPLANT
TOWEL GREEN STERILE (TOWEL DISPOSABLE) ×3 IMPLANT
TOWEL GREEN STERILE FF (TOWEL DISPOSABLE) ×3 IMPLANT
TRAP SPECIMEN MUCOUS 40CC (MISCELLANEOUS) ×3 IMPLANT
WATER STERILE IRR 1000ML POUR (IV SOLUTION) ×3 IMPLANT

## 2016-11-11 NOTE — H&P (Signed)
Teresa Yu is an 51 y.o. female.   Chief Complaint: Neck pain HPI: 51 year old female with severe neck and bilateral upper extremity pain paresthesias with weakness. Workup demonstrates evidence of marked spondylosis with degenerative kyphosis and stenosis at C4-5 and C5-6. Patient has failed conservative management presents now for decompression and fusion at C4-5 and C5-6 in hopes of improving her symptoms.  Past Medical History:  Diagnosis Date  . Abnormal RBC    BEEN HIGH X 1 YR  NOT SURE OF WHY   . Anxiety    takes Ativan daily  . Arthritis   . Depression    takes Celexa daily  . Headache    states she has had a headache for 2 days  . Joint pain   . Joint swelling   . Legally blind   . Nocturia   . Ocular histoplasmosis    uses drops as needed  . Peripheral neuropathy   . PONV (postoperative nausea and vomiting)    HEARING DIMINISHES, SWEATY, DIZZY, N+V  . Seizures (HCC)    MYOCLONIC JERKING   NONE IN 1 YEAR  . Weakness    numbness and tingling     Past Surgical History:  Procedure Laterality Date  . BACK SURGERY     4 MO AGO  [facility]  . CATARACT EXTRACTION W/PHACO Left 08/22/2014   Procedure: CATARACT EXTRACTION PHACO AND INTRAOCULAR LENS PLACEMENT LEFT EYE;  Surgeon: Gemma Payor, MD;  Location: [facility];  Service: Ophthalmology;  Laterality: Left;  CDE 24.24  . CESAREAN SECTION  1984  . EYE SURGERY Bilateral 1995, 1998   macular generation  . EYE SURGERY Bilateral    x 2  . MYRINGOTOMY WITH TUBE PLACEMENT     as a child  . TUBAL LIGATION      Family History  Problem Relation Age of Onset  . Cancer Brother   . Seizures Neg Hx   . Multiple sclerosis Neg Hx    Social History:  reports that she has been smoking Cigarettes.  She has a 36.00 pack-year smoking history. She has never used smokeless tobacco. She reports that she does not drink alcohol or use drugs.  Allergies:  Allergies  Allergen Reactions  . Asa [Aspirin] Rash    In large quantities    VOMIT,  DIARRHEA, VISION, SWELLING    Medications Prior to Admission  Medication Sig Dispense Refill  . Black Cohosh-SoyIsoflav-Magnol (ESTROVEN MENOPAUSE RELIEF) CAPS Take 1 capsule by mouth daily.    . Carboxymethylcellulose Sodium (LUBRICANT EYE DROPS OP) Place 1 drop into both eyes 3 (three) times daily as needed (for dry eyes).    . citalopram (CELEXA) 20 MG tablet Take 20 mg by mouth daily.    Marland Kitchen DM-Doxylamine-Acetaminophen (NYQUIL COLD & FLU PO) Take 4 capsules by mouth at bedtime.    . Oxycodone HCl 10 MG TABS Take 10 mg by mouth every 6 (six) hours.      No results found for this or any previous visit (from the past 48 hour(s)). No results found.  Pertinent items noted in HPI and remainder of comprehensive ROS otherwise negative.  Blood pressure 138/72, pulse 76, temperature 98.1 F (36.7 C), temperature source Oral, resp. rate 20, weight 97.1 kg (214 lb), SpO2 95 %.  Patient is awake and alert. She is oriented and appropriate. She stopped is uncomfortable. Examination of her head ears eyes nose and throat are marked. Chest and abdomen are benign. Extremities are free from injury deformity. Neurologically she is  awake and alert. Speech is fluent. Judgment and insight are intact. Cranial nerve function normal bilaterally. Motor examination reveals mild weakness of grip and intrinsic function in both upper extremities. Sensory examination with patchy distal sensory loss in both upper extremities. Reflexes are mildly increased. Hoffmann's responses are present in both hands. No evidence of clonus. 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 Plan C4-5 and C5-6 anterior cervical discectomy and fusion with interbody cages, locally harvested autograft, and anterior plate is mentation. Risks and benefits been explained. Patient wishes to proceed.  Iran Kievit A 11/11/2016, 7:47 AM

## 2016-11-11 NOTE — Op Note (Signed)
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  Asst. Surgeon: None  Anesthesia: General  Indication: 51 year old female with severe neck and bilateral upper trimming symptoms failing conservative management. Workup demonstrates evidence of marked disc degeneration with associated kyphosis and spondylosis causing severe spinal stenosis at C4-5 and moderately severe stenosis C5-6. Patient is failed conservative management and presents now for operative decompression and fusion in hopes of improving her symptoms.  Operative note: After induction of anesthesia, patient position supine with Extended and held in place of halter traction. Anterior cervical region prepped draped sterilely. Incision made overlying C5. Dissection performed on the right. Retractor placed. Fluoroscopy used. Levels confirmed. Disc spaces at C4-5 and C5-6 incised. Discectomies performed using various instruments down to level posterior annulus. Microscope front field used throughout the remainder of the discectomy. When he aspects of annulus and osteophytes removed using high-speed drill down to the level of the posterior wall she'll ligament. Posterior lateral was elevated and resected in piecemeal fashion. Underlying thecal sac was identified. Wide central decompression then performed and the bodies of C4 and C5. Decompression then proceeded into each neural foramen. Wide anterior foraminotomies were performed on course exiting C5 nerve roots bilaterally. At this point a very thorough decompression been achieved. There was no evidence of injury to the thecal sac or nerve roots. Procedure  then repeated at C5-6 and without complications. Wounds and irrigated lesion. [employer] anatomic peek cages were then packed with locally harvested autograft. Each cage was then impacted in place and recessed slightly from the anterior cortical margin. 42 mm Atlantis anterior cervical plate was then placed over the C4-5 and 6 levels. This an attachment or fluoroscopic guidance using 13 mm variable-angle screws to reach it all 3 final images revealed good position of the cages and the is mentation at the proper upper level with normal lamina spine. Wound is inspected for hemostasis. Wounds and close in layers of Vicryl sutures. Steri-Strips and sterile dressing were applied. No apparent complications. Patient tolerated the procedure well and she returns to the recovery room postop.

## 2016-11-11 NOTE — Anesthesia Postprocedure Evaluation (Signed)
Anesthesia Post Note  Patient: Teresa Yu  Procedure(s) Performed: ANTERIOR CERVICAL DECOMPRESSION/DISCECTOMY FUSION CERVICAL 4-5, CERVICAL 5-6 (N/A )     Patient location during evaluation: PACU Anesthesia Type: General Level of consciousness: awake and alert Pain management: pain level controlled Vital Signs Assessment: post-procedure vital signs reviewed and stable Respiratory status: spontaneous breathing, nonlabored ventilation, respiratory function stable and patient connected to nasal cannula oxygen Cardiovascular status: blood pressure returned to baseline and stable Postop Assessment: no apparent nausea or vomiting Anesthetic complications: no    Last Vitals:  Vitals:   11/11/16 1106 11/11/16 1141  BP: 140/76 (!) 141/71  Pulse: 98 94  Resp: 17 16  Temp: 37 C 37 C  SpO2: 92% 95%    Last Pain:  Vitals:   11/11/16 1106  TempSrc:   PainSc: 3                  Shelton Silvas

## 2016-11-11 NOTE — Progress Notes (Signed)
Orthopedic Tech Progress Note Patient Details:  Teresa Yu 29-May-1965 [id number]  Ortho Devices Type of Ortho Device: Soft collar Ortho Device/Splint Location: droped off to OR   Saul Fordyce 11/11/2016, 8:19 AM

## 2016-11-11 NOTE — Brief Op Note (Signed)
11/11/2016  10:10 AM  PATIENT:  Teresa Yu  51 y.o. female  PRE-OPERATIVE DIAGNOSIS:  SPINAL STENOSIS, CERVICAL REGION  POST-OPERATIVE DIAGNOSIS:  SPINAL STENOSIS, CERVICAL REGION  PROCEDURE:  Procedure(s) with comments: ANTERIOR CERVICAL DECOMPRESSION/DISCECTOMY FUSION CERVICAL 4-5, CERVICAL 5-6 (N/A) - ANTERIOR CERVICAL DECOMPRESSION/DISCECTOMY FUSION CERVICAL 4-5, CERVICAL 5-6  SURGEON:  Surgeon(s) and Role:    * Julio Sicks, MD - Primary  PHYSICIAN ASSISTANT:   ASSISTANTS:    ANESTHESIA:   general  EBL:  Total I/O In: 1000 [I.V.:1000] Out: 50 [Blood:50]  BLOOD ADMINISTERED:none  DRAINS: none   LOCAL MEDICATIONS USED:  NONE  SPECIMEN:  No Specimen  DISPOSITION OF SPECIMEN:  N/A  COUNTS:  YES  TOURNIQUET:  * No tourniquets in log *  DICTATION: .Dragon Dictation  PLAN OF CARE: Admit to inpatient   PATIENT DISPOSITION:  PACU - hemodynamically stable.   Delay start of Pharmacological VTE agent (>24hrs) due to surgical blood loss or risk of bleeding: yes

## 2016-11-11 NOTE — Transfer of Care (Signed)
Immediate Anesthesia Transfer of Care Note  Patient: Teresa Yu  Procedure(s) Performed: ANTERIOR CERVICAL DECOMPRESSION/DISCECTOMY FUSION CERVICAL 4-5, CERVICAL 5-6 (N/A )  Patient Location: PACU  Anesthesia Type:General  Level of Consciousness: awake, alert , oriented and patient cooperative  Airway & Oxygen Therapy: Patient Spontanous Breathing and Patient connected to face mask oxygen  Post-op Assessment: Report given to RN, Post -op Vital signs reviewed and stable and Patient moving all extremities X 4  Post vital signs: Reviewed and stable  Last Vitals:  Vitals:   11/11/16 0626  BP: 138/72  Pulse: 76  Resp: 20  Temp: 36.7 C  SpO2: 95%    Last Pain:  Vitals:   11/11/16 0702  TempSrc:   PainSc: 5       Patients Stated Pain Goal: 3 (11/11/16 1610)  Complications: No apparent anesthesia complications

## 2016-11-11 NOTE — Anesthesia Procedure Notes (Signed)
Procedure Name: Intubation Date/Time: 11/11/2016 8:11 AM Performed by: Mervyn Gay Pre-anesthesia Checklist: Patient identified, Patient being monitored, Timeout performed, Emergency Drugs available and Suction available Patient Re-evaluated:Patient Re-evaluated prior to induction Oxygen Delivery Method: Circle System Utilized Preoxygenation: Pre-oxygenation with 100% oxygen Induction Type: IV induction Ventilation: Mask ventilation without difficulty and Oral airway inserted - appropriate to patient size Laryngoscope Size: 3, Glidescope and Mac Grade View: Grade I Tube type: Oral Tube size: 7.5 mm Number of attempts: 1 Airway Equipment and Method: Stylet and Video-laryngoscopy Placement Confirmation: ETT inserted through vocal cords under direct vision,  positive ETCO2 and breath sounds checked- equal and bilateral Secured at: 21 cm Tube secured with: Tape Dental Injury: Teeth and Oropharynx as per pre-operative assessment

## 2016-11-12 MED ORDER — OXYCODONE HCL 10 MG PO TABS: 10.0000 mg | ORAL_TABLET | Freq: Four times a day (QID) | ORAL | 0 refills | Status: AC

## 2016-11-12 NOTE — Discharge Summary (Signed)
Physician Discharge Summary  Patient ID: Teresa Yu MRN: [id number] DOB/AGE: Feb 27, 1965 51 y.o.  Admit date: 11/11/2016 Discharge date: 11/12/2016  Admission Diagnoses:  Discharge Diagnoses:  Active Problems:   Cervical spinal stenosis   Discharged Condition: good  Hospital Course: Patient admitted the hospital where she underwent an uncomplicated two-level anterior cervical decompression and fusion. Postoperative she is doing recently well. Preoperative severe bilateral upper extremity symptoms much improved. Ambulating without difficulty. Swallowing well. Ready for discharge home.  Consults:   Significant Diagnostic Studies:   Treatments:   Discharge Exam: Blood pressure 122/64, pulse 68, temperature 98.7 F (37.1 C), resp. rate 16, weight 97.1 kg (214 lb), SpO2 95 %. Awake and alert. Oriented and appropriate. Cranial nerve function intact. Speech fluent. Judgment and insight intact. Motor and sensory function extremities normal. Wound clean and dry. Chest and abdomen benign.  Disposition: 01-Home or Self Care   Allergies as of 11/12/2016      Reactions   Asa [aspirin] Rash   In large quantities    VOMIT, DIARRHEA, VISION, SWELLING      Medication List    TAKE these medications   citalopram 20 MG tablet Commonly known as:  CELEXA Take 20 mg by mouth daily.   ESTROVEN MENOPAUSE RELIEF Caps Take 1 capsule by mouth daily.   LUBRICANT EYE DROPS OP Place 1 drop into both eyes 3 (three) times daily as needed (for dry eyes).   NYQUIL COLD & FLU PO Take 4 capsules by mouth at bedtime.   Oxycodone HCl 10 MG Tabs Take 1 tablet (10 mg total) by mouth every 6 (six) hours.      Follow-up Information    Julio Sicks, MD Follow up.   Specialty:  Neurosurgery Contact information: [address] [place] [address] [phone]           Signed: Temple Pacini 11/12/2016, 1:03 PM

## 2016-11-12 NOTE — Discharge Instructions (Signed)

## 2016-11-12 NOTE — Progress Notes (Signed)
Pt discharged home per MD order. Pt and family stated understanding of instructions given.

## 2016-11-13 ENCOUNTER — Encounter (HOSPITAL_COMMUNITY): Payer: Self-pay | Admitting: Neurosurgery

## 2017-05-13 ENCOUNTER — Other Ambulatory Visit: Payer: Self-pay | Admitting: Neurosurgery

## 2017-05-13 DIAGNOSIS — M4802 Spinal stenosis, cervical region: Secondary | ICD-10-CM

## 2017-05-13 DIAGNOSIS — M4310 Spondylolisthesis, site unspecified: Secondary | ICD-10-CM

## 2017-05-22 ENCOUNTER — Other Ambulatory Visit: Payer: Medicaid Other

## 2017-05-30 ENCOUNTER — Ambulatory Visit
Admission: RE | Admit: 2017-05-30 | Discharge: 2017-05-30 | Disposition: A | Discharge status: Home / Self Care | Payer: Medicaid Other | Source: Ambulatory Visit | Attending: Neurosurgery | Admitting: Neurosurgery

## 2017-05-30 DIAGNOSIS — M4310 Spondylolisthesis, site unspecified: Secondary | ICD-10-CM

## 2017-05-30 DIAGNOSIS — M4802 Spinal stenosis, cervical region: Secondary | ICD-10-CM

## 2017-06-26 ENCOUNTER — Encounter: Payer: Self-pay | Admitting: *Deleted

## 2017-07-01 ENCOUNTER — Encounter: Payer: Medicaid Other | Admitting: Obstetrics & Gynecology

## 2019-08-16 IMAGING — RF DG C-ARM 61-120 MIN
1 series · 2 of 2 positions shown · non-contrast
Comparison: None.

CLINICAL DATA: C4-C6 ACDF

EXAM:
DG C-ARM 61-120 MIN; CERVICAL SPINE - 2-3 VIEW

[Series 1: run · 2 of 2 slices shown]
[im 1/2]
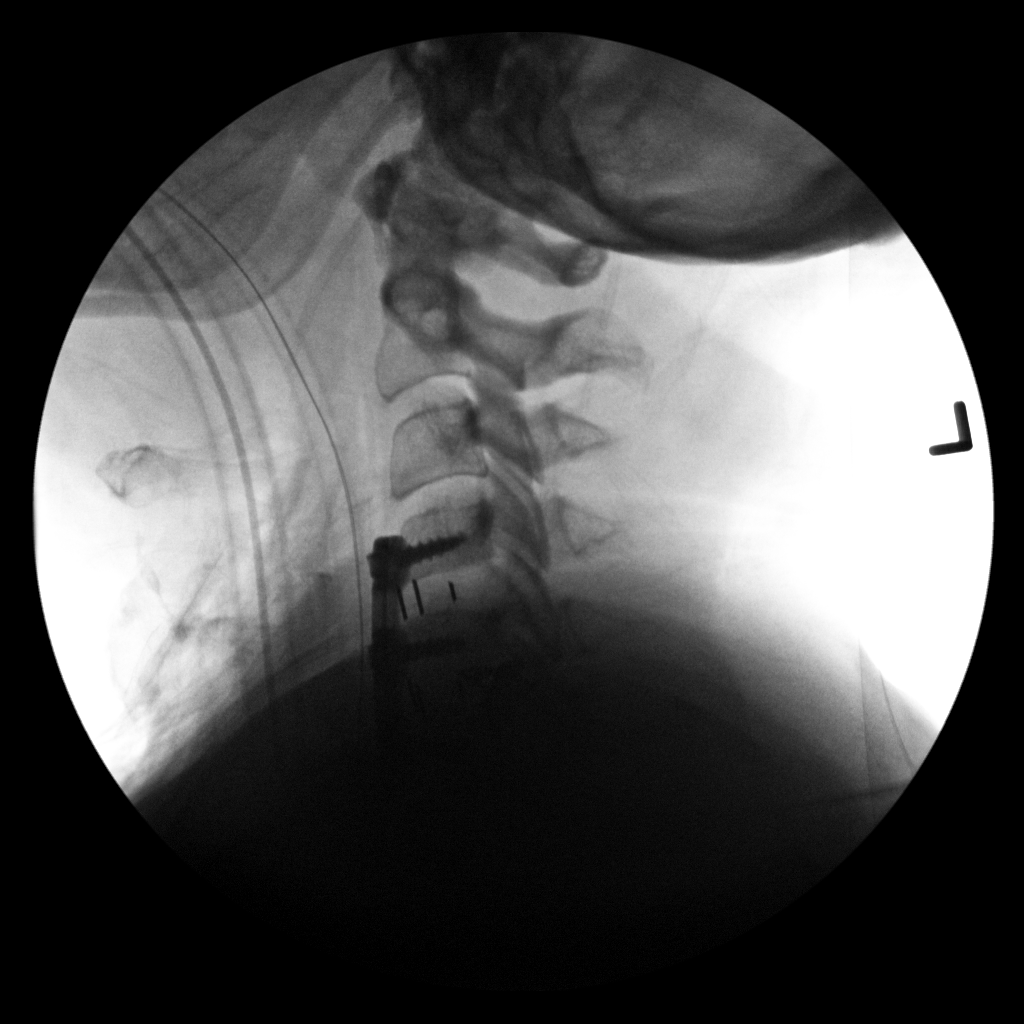
[im 2/2]
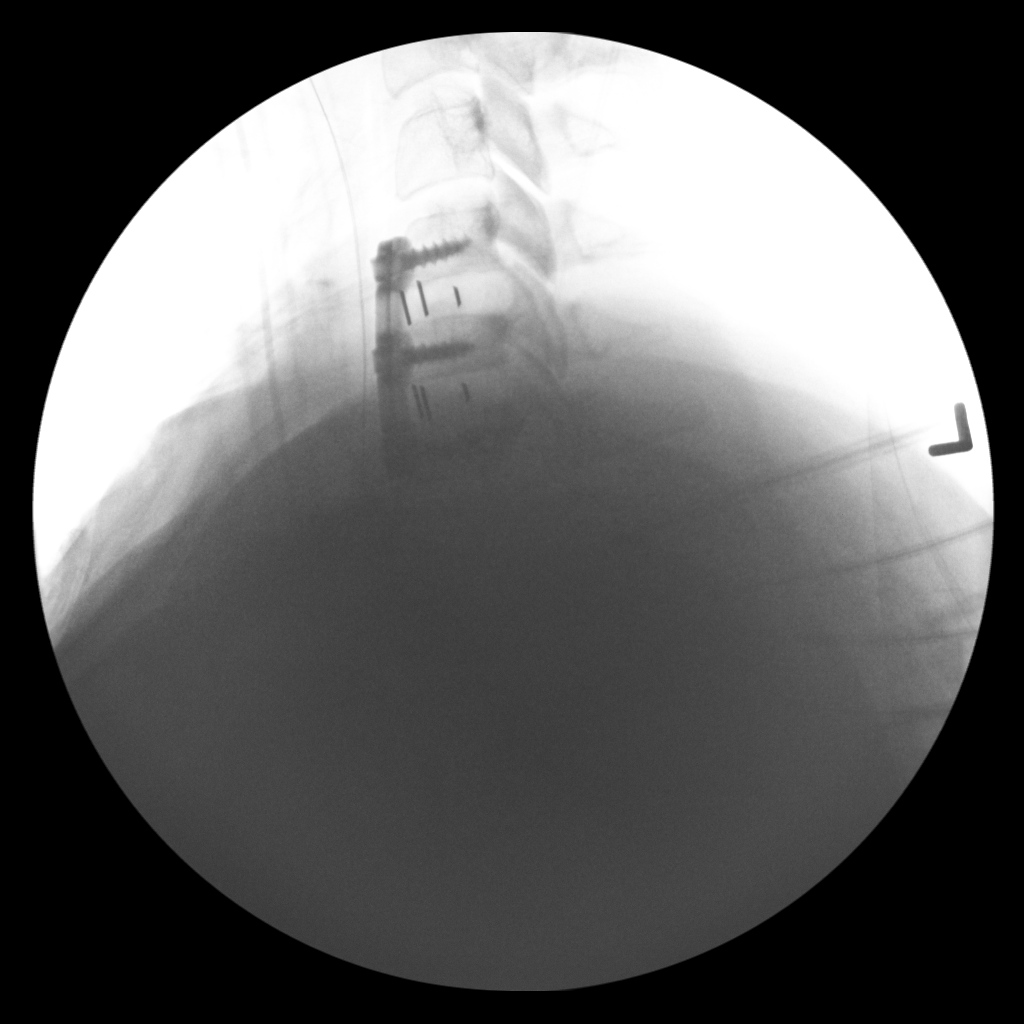

[2 of 2 positions shown; findings below may reference images not displayed]

FINDINGS: Two fluoroscopic lateral images of the cervical spine show hardware
for C4-5 and C5-6 ACDF. In the lateral projection there is normal
positioning of the cage and ventral plate. No evidence of fracture.
IMPRESSION: Fluoroscopy for C4-5 and C5-6 ACDF.  No unexpected finding.

## 2020-06-05 ENCOUNTER — Encounter: Payer: Self-pay | Admitting: Internal Medicine

## 2020-07-27 ENCOUNTER — Encounter: Payer: Self-pay | Admitting: Internal Medicine

## 2020-09-11 ENCOUNTER — Ambulatory Visit: Payer: Medicaid Other | Admitting: Gastroenterology

## 2020-12-07 NOTE — Progress Notes (Deleted)
Referring Provider: April Manson, NP Primary Care Physician:  April Manson, NP Primary Gastroenterologist:  Dr. Jena Gauss  No chief complaint on file.   HPI:   Teresa Yu is a 55 y.o. female presenting today at the request of White, Bonnell Public, NP for consult colonoscopy.  Recommended office visit due to medications, BMI, insomnia.  Per chart review, patient was admitted to [facility] with colitis in March following cholecystectomy.  CT A/P with contrast completed during admission which revealed equivocal circumferential wall thickening of the transverse and descending colon more likely related to poor distention, but could represent colitis.  Also with 2.3 x 2.8 cm collection with small foci of gas in the cholecystectomy bed with out adjacent inflammation, nonspecific and suspected to be secondary to recent surgery.  She was treated conservatively with IV antibiotics with resolution of symptoms.  She was discharged with a 10-day course of Cipro and Flagyl.    Today:    Past Medical History:  Diagnosis Date   Abnormal RBC    BEEN HIGH X 1 YR  NOT SURE OF WHY    Anxiety    takes Ativan daily   Arthritis    Depression    takes Celexa daily   Headache    states she has had a headache for 2 days   Joint pain    Joint swelling    Legally blind    Nocturia    Ocular histoplasmosis    uses drops as needed   Peripheral neuropathy    PONV (postoperative nausea and vomiting)    HEARING DIMINISHES, SWEATY, DIZZY, N+V   Seizures (HCC)    MYOCLONIC JERKING   NONE IN 1 YEAR   Weakness    numbness and tingling     Past Surgical History:  Procedure Laterality Date   ANTERIOR CERVICAL DECOMP/DISCECTOMY FUSION N/A 11/11/2016   Procedure: ANTERIOR CERVICAL DECOMPRESSION/DISCECTOMY FUSION CERVICAL 4-5, CERVICAL 5-6;  Surgeon: Julio Sicks, MD;  Location: [facility] OR;  Service: Neurosurgery;  Laterality: N/A;  ANTERIOR CERVICAL DECOMPRESSION/DISCECTOMY FUSION CERVICAL 4-5, CERVICAL 5-6    BACK SURGERY     4 MO AGO  [facility]   CATARACT EXTRACTION W/PHACO Left 08/22/2014   Procedure: CATARACT EXTRACTION PHACO AND INTRAOCULAR LENS PLACEMENT LEFT EYE;  Surgeon: Gemma Payor, MD;  Location: [facility];  Service: Ophthalmology;  Laterality: Left;  CDE 24.24   CESAREAN SECTION  1984   EYE SURGERY Bilateral 1995, 1998   macular generation   EYE SURGERY Bilateral    x 2   MYRINGOTOMY WITH TUBE PLACEMENT     as a child   TUBAL LIGATION      Current Outpatient Medications  Medication Sig Dispense Refill   Black Cohosh-SoyIsoflav-Magnol (ESTROVEN MENOPAUSE RELIEF) CAPS Take 1 capsule by mouth daily.     Carboxymethylcellulose Sodium (LUBRICANT EYE DROPS OP) Place 1 drop into both eyes 3 (three) times daily as needed (for dry eyes).     citalopram (CELEXA) 20 MG tablet Take 20 mg by mouth daily.     DM-Doxylamine-Acetaminophen (NYQUIL COLD & FLU PO) Take 4 capsules by mouth at bedtime.     Oxycodone HCl 10 MG TABS Take 1 tablet (10 mg total) by mouth every 6 (six) hours. 60 tablet 0   No current facility-administered medications for this visit.    Allergies as of 12/08/2020 - Review Complete 11/11/2016  Allergen Reaction Noted   Asa [aspirin] Rash 08/11/2014    Family History  Problem Relation Age of  Onset   Cancer Brother    Seizures Neg Hx    Multiple sclerosis Neg Hx     Social History   Socioeconomic History   Marital status: Married    Spouse name: Not on file   Number of children: Not on file   Years of education: Not on file   Highest education level: Not on file  Occupational History   Not on file  Tobacco Use   Smoking status: Every Day    Packs/day: 1.00    Years: 36.00    Pack years: 36.00    Types: Cigarettes   Smokeless tobacco: Never  Vaping Use   Vaping Use: Never used  Substance and Sexual Activity   Alcohol use: No   Drug use: No    Comment: 2 per week   Sexual activity: Yes  Other Topics Concern   Not on file  Social History Narrative   Not on  file   Social Determinants of Health   Financial Resource Strain: Not on file  Food Insecurity: Not on file  Transportation Needs: Not on file  Physical Activity: Not on file  Stress: Not on file  Social Connections: Not on file  Intimate Partner Violence: Not on file    Review of Systems: Gen: Denies any fever, chills, fatigue, weight loss, lack of appetite.  CV: Denies chest pain, heart palpitations, peripheral edema, syncope.  Resp: Denies shortness of breath at rest or with exertion. Denies wheezing or cough.  GI: Denies dysphagia or odynophagia. Denies jaundice, hematemesis, fecal incontinence. GU : Denies urinary burning, urinary frequency, urinary hesitancy MS: Denies joint pain, muscle weakness, cramps, or limitation of movement.  Derm: Denies rash, itching, dry skin Psych: Denies depression, anxiety, memory loss, and confusion Heme: Denies bruising, bleeding, and enlarged lymph nodes.  Physical Exam: There were no vitals taken for this visit. General:   Alert and oriented. Pleasant and cooperative. Well-nourished and well-developed.  Head:  Normocephalic and atraumatic. Eyes:  Without icterus, sclera clear and conjunctiva pink.  Ears:  Normal auditory acuity. Nose:  No deformity, discharge,  or lesions. Mouth:  No deformity or lesions, oral mucosa pink.  Neck:  Supple, without mass or thyromegaly. Lungs:  Clear to auscultation bilaterally. No wheezes, rales, or rhonchi. No distress.  Heart:  S1, S2 present without murmurs appreciated.  Abdomen:  +BS, soft, non-tender and non-distended. No HSM noted. No guarding or rebound. No masses appreciated.  Rectal:  Deferred  Msk:  Symmetrical without gross deformities. Normal posture. Pulses:  Normal pulses noted. Extremities:  Without clubbing or edema. Neurologic:  Alert and  oriented x4;  grossly normal neurologically. Skin:  Intact without significant lesions or rashes. Cervical Nodes:  No significant cervical  adenopathy. Psych:  Alert and cooperative. Normal mood and affect.

## 2020-12-08 ENCOUNTER — Ambulatory Visit: Payer: Medicaid Other | Admitting: Gastroenterology

## 2022-08-11 LAB — COLOGUARD: COLOGUARD: NEGATIVE
